# Patient Record
Sex: Female | Born: 1970 | Race: Black or African American | Hispanic: No | Marital: Married | Smoking: Never smoker
Health system: Southern US, Community
[De-identification: ages and names within clinical notes are randomized; demographics above are authoritative.]

## PROBLEM LIST (undated history)

## (undated) DIAGNOSIS — M1990 Unspecified osteoarthritis, unspecified site: Secondary | ICD-10-CM

## (undated) DIAGNOSIS — R519 Headache, unspecified: Secondary | ICD-10-CM

## (undated) DIAGNOSIS — Z8701 Personal history of pneumonia (recurrent): Secondary | ICD-10-CM

## (undated) DIAGNOSIS — J189 Pneumonia, unspecified organism: Secondary | ICD-10-CM

## (undated) DIAGNOSIS — R51 Headache: Secondary | ICD-10-CM

## (undated) HISTORY — PX: CHOLECYSTECTOMY: SHX55

---

## 1999-02-17 ENCOUNTER — Ambulatory Visit (HOSPITAL_COMMUNITY): Admission: RE | Admit: 1999-02-17 | Discharge: 1999-02-17 | Payer: Self-pay | Admitting: Obstetrics and Gynecology

## 1999-02-17 ENCOUNTER — Encounter: Payer: Self-pay | Admitting: Obstetrics and Gynecology

## 1999-05-05 ENCOUNTER — Ambulatory Visit (HOSPITAL_COMMUNITY): Admission: RE | Admit: 1999-05-05 | Discharge: 1999-05-05 | Payer: Self-pay | Admitting: Obstetrics and Gynecology

## 1999-05-05 ENCOUNTER — Encounter: Payer: Self-pay | Admitting: Obstetrics and Gynecology

## 1999-05-27 ENCOUNTER — Encounter (INDEPENDENT_AMBULATORY_CARE_PROVIDER_SITE_OTHER): Payer: Self-pay

## 1999-05-27 ENCOUNTER — Inpatient Hospital Stay (HOSPITAL_COMMUNITY): Admission: AD | Admit: 1999-05-27 | Discharge: 1999-05-31 | Payer: Self-pay | Admitting: Obstetrics and Gynecology

## 1999-06-04 ENCOUNTER — Inpatient Hospital Stay (HOSPITAL_COMMUNITY): Admission: AD | Admit: 1999-06-04 | Discharge: 1999-06-04 | Payer: Self-pay | Admitting: Obstetrics and Gynecology

## 1999-06-05 ENCOUNTER — Encounter: Payer: Self-pay | Admitting: Obstetrics and Gynecology

## 2008-07-22 ENCOUNTER — Emergency Department (HOSPITAL_COMMUNITY): Admission: EM | Admit: 2008-07-22 | Discharge: 2008-07-22 | Payer: Self-pay | Admitting: Family Medicine

## 2008-07-29 ENCOUNTER — Emergency Department (HOSPITAL_COMMUNITY): Admission: EM | Admit: 2008-07-29 | Discharge: 2008-07-29 | Payer: Self-pay | Admitting: Emergency Medicine

## 2010-11-25 NOTE — Op Note (Signed)
[facility]  Patient:    Victoria Kaufman                        MRN: [id number] Proc. Date: 05/27/99 Adm. Date:  [id number] Attending:  Michaele Offer                           Operative Report  PREOPERATIVE DIAGNOSIS:       Intrauterine pregnancy at 40 weeks.  Breech presentation.  Group B Strep carrier.  Desires surgical sterility.  POSTOPERATIVE DIAGNOSIS:      Intrauterine pregnancy at 40 weeks.  Breech presentation.  Group B Strep carrier.  Desires surgical sterility.  OPERATION:                    Classical cesarean section and bilateral partial salpingectomy.  SURGEON:                      Zenaida Niece, M.D.  ASSISTANT:  ANESTHESIA:                   Spinal.  ESTIMATED BLOOD LOSS:         1200 cc.  CHEMOPROPHYLAXIS:             Penicillin for Group B Strep prior to procedure and Ancef 1 gram after cord clamp.  FINDINGS:                     Essentially no lower uterine segment necessitating the classical cesarean section.  The fetus was in a double footling breech presentation and was a viable female with Apgars of 8 and 8 that weighed 6 pounds 11 ounces and did sustain a laceration to the right heel.  COUNTS:                       Correct.  CONDITION:                    Stable.  DESCRIPTION OF PROCEDURE:     After appropriate informced consent was obtained, the patient was taken to the operating room and placed in the sitting position. Burnett Corrente, Montez Hageman., M.D. instilled spinal anesthesia, and she was placed in the dorsal supine position with a left lateral tilt.  Her abdomen was prepped and draped in the usual sterile fashion for a cesarean section and a Foley catheter inserted.  The level of her anesthesia was found to be adequate and her abdomen was entered via a standard Pfannenstiel incision.  The bladder blade was placed and the vesicouterine peritoneum was difficult to identify.  It was finally  identified nd incised and the bladder pushed inferiorly.  However, the lower uterine segment appeared very very narrow and I did not feel that there would be enough room to  deliver the breech.  Thus, I decided to proceed with a classical cesarean section. A vertical incision was made in the midline of the uterus and carried down to the endometrial cavity.  The cavity was entered superiorly and then was attempting o enter somewhat inferiorly to this the fetal heel came to the incision was lacerated with the knife.  The incision was then extended superiorly and a short distance  inferiorly with bandage scissors to facilitate delivery.  The feet were grasped and the infant delivered to the shoulders.  The arms were  swept across the chest and the head was easily delivered.  A nuchal cord x 2 was reduced.  The mouth and nares were suctioned and the cord was doubly clamped and cut and the infant handed to the awaiting pediatric team.  Cord blood and a segment of cord for gas were obtained and the placenta delivered spontaneously.  The uterus was exteriorized and wiped dry with a clean lap pad.  The incision was then closed in three layers, the first two layers being running locking layers with #1 chromic and the third layer being a baseball stitch to reapproximate uterine serosa with 3-0 Vicryl.  This achieved  good hemostasis and good uterine closure.  Attention was then turned to the tubal ligation.  A knuckle of tube on each side was ligated with 0 plain gut suture and the knuckle of tube was removed.  Both ostia were identified and the stumps were hemostatic.  At this point the uterus was returned to the abdomen.  Both pericolic gutters were inspected and all clots and debris removed, and both tubal ligation sites found to be intact.  The uterine incision was again inspected and found to be hemostatic.  Bleeding from serosal edges was controlled with electrocautery  as ell as a few small bleeders down on the peritoneum beneath the bladder.  The subfascial space was then irrigated and made hemostatic with electrocautery.  The fascia was closed in a running fashion starting at both ends and meeting in the middle with 0 Vicryl.  The subcutaneous tissue was then irrigated and made hemostatic with electrocautery and the subcutaneous tissue was closed with a running suture of -0 Vicryl.  The skin was then closed with staples.  A sterile dressing was applied. The patient tolerated the procedure well and was taken to the recovery room in stable condition. DD:  05/27/99 TD:  05/30/99 Job: [id number]

## 2012-12-23 ENCOUNTER — Emergency Department (INDEPENDENT_AMBULATORY_CARE_PROVIDER_SITE_OTHER): Payer: PRIVATE HEALTH INSURANCE

## 2012-12-23 ENCOUNTER — Emergency Department (HOSPITAL_COMMUNITY)
Admission: EM | Admit: 2012-12-23 | Discharge: 2012-12-23 | Disposition: A | Discharge status: Home / Self Care | Payer: PRIVATE HEALTH INSURANCE | Source: Home / Self Care | Attending: Emergency Medicine | Admitting: Emergency Medicine

## 2012-12-23 ENCOUNTER — Encounter (HOSPITAL_COMMUNITY): Payer: Self-pay | Admitting: Emergency Medicine

## 2012-12-23 DIAGNOSIS — J189 Pneumonia, unspecified organism: Secondary | ICD-10-CM

## 2012-12-23 MED ORDER — HYDROCOD POLST-CHLORPHEN POLST 10-8 MG/5ML PO LQCR: 5.0000 mL | Freq: Two times a day (BID) | ORAL | Status: DC | PRN

## 2012-12-23 MED ORDER — CEFTRIAXONE SODIUM 1 G IJ SOLR
1.0000 g | Freq: Once | INTRAMUSCULAR | Status: AC
  Administered 2012-12-23: 1 g via INTRAMUSCULAR

## 2012-12-23 MED ORDER — CEFTRIAXONE SODIUM 1 G IJ SOLR
INTRAMUSCULAR | Status: AC
  Filled 2012-12-23: qty 10

## 2012-12-23 MED ORDER — LIDOCAINE HCL (PF) 1 % IJ SOLN
INTRAMUSCULAR | Status: AC
  Filled 2012-12-23: qty 5

## 2012-12-23 MED ORDER — IBUPROFEN 800 MG PO TABS
800.0000 mg | ORAL_TABLET | Freq: Once | ORAL | Status: AC
Start: 2012-12-23 — End: 2012-12-23
  Administered 2012-12-23: 800 mg via ORAL

## 2012-12-23 MED ORDER — LEVOFLOXACIN 500 MG PO TABS: 500.0000 mg | ORAL_TABLET | Freq: Every day | ORAL | Status: DC

## 2012-12-23 MED ORDER — TRAMADOL HCL 50 MG PO TABS: 100.0000 mg | ORAL_TABLET | Freq: Three times a day (TID) | ORAL | Status: DC | PRN

## 2012-12-23 MED ORDER — ALBUTEROL SULFATE HFA 108 (90 BASE) MCG/ACT IN AERS: 2.0000 | INHALATION_SPRAY | Freq: Four times a day (QID) | RESPIRATORY_TRACT | Status: DC

## 2012-12-23 MED ORDER — IBUPROFEN 800 MG PO TABS
ORAL_TABLET | ORAL | Status: AC
  Filled 2012-12-23: qty 1

## 2012-12-23 NOTE — ED Notes (Signed)
Ginger ale, peanut butter and grahma crackers

## 2012-12-23 NOTE — ED Notes (Signed)
Patient aware of delay in discharge post injection administration

## 2012-12-23 NOTE — ED Notes (Signed)
Patient reports severe cough, onset last Tuesday.  Patient reports a cough several weeks ago, but not nearly as bad as this cough and seemed to go away.  Reports runny nose today, reports fever, chest congestion.  Has pain in chest and back with cough.

## 2012-12-23 NOTE — ED Provider Notes (Signed)
Chief Complaint:   Chief Complaint  Patient presents with  . Cough    History of Present Illness:   Victoria Kaufman is a 42 year old female who has had a one-week history of cough productive of yellow, bloody sputum, chest pain when she coughs radiating to her back, shortness of breath, chest tightness, wheezing, fever of up to 102, chills, nasal congestion with clear drainage, and headache she has been exposed to a coworker with similar symptoms. She tried DayQuil and Alka-Seltzer cold plus for her symptoms without relief.  Review of Systems:  Other than noted above, the patient denies any of the following symptoms: Systemic:  No fevers, chills, sweats, weight loss or gain, fatigue, or tiredness. Eye:  No redness or discharge. ENT:  No ear pain, drainage, headache, nasal congestion, drainage, sinus pressure, difficulty swallowing, or sore throat. Neck:  No neck pain or swollen glands. Lungs:  No cough, sputum production, hemoptysis, wheezing, chest tightness, shortness of breath or chest pain. GI:  No abdominal pain, nausea, vomiting or diarrhea.  PMFSH:  Past medical history, family history, social history, meds, and allergies were reviewed.   Physical Exam:   Vital signs:  BP 142/85  Pulse 101  Temp(Src) 99.8 F (37.7 C) (Oral)  Resp 18  SpO2 99%  LMP 12/21/2012 General:  Alert and oriented.  In no distress.  Skin warm and dry. Eye:  No conjunctival injection or drainage. Lids were normal. ENT:  TMs and canals were normal, without erythema or inflammation.  Nasal mucosa was clear and uncongested, without drainage.  Mucous membranes were moist.  Pharynx was clear with no exudate or drainage.  There were no oral ulcerations or lesions. Neck:  Supple, no adenopathy, tenderness or mass. Lungs:  No respiratory distress.  Lungs were clear to auscultation, without wheezes, rales or rhonchi.  Breath sounds were clear and equal bilaterally.  Heart:  Regular rhythm, without gallops, murmers  or rubs. Skin:  Clear, warm, and dry, without rash or lesions.  Radiology:  Dg Chest 2 View  12/23/2012   *RADIOLOGY REPORT*  Clinical Data: 1 week history of productive cough, fever and, this is  CHEST - 2 VIEW  Comparison: 07/29/2008  Findings: Grossly unchanged cardiac silhouette and mediastinal contours given slightly reduced lung volumes.  Left infrahilar heterogeneous air space opacities worrisome for infection.  No definite pleural effusion or pneumothorax.  A surgical clip overlies the midline of the right upper abdomen.  Grossly unchanged bones.  IMPRESSION: Findings worrisome for left lower lung pneumonia.  Note, this is a similar location to prior area of infection on the 2010 examination.  As such, a follow-up chest radiograph in 4 to 6 weeks after treatment is recommended to ensure resolution.   Original Report Authenticated By: Tacey Ruiz, MD    Course in Urgent Care Center:   Given Rocephin 1 g IM. For pain she was given ibuprofen 800 mg by mouth.  Assessment:  The encounter diagnosis was Community acquired pneumonia.  She has a left lower lobe pneumonia. I think this can be treated as an outpatient. She was given IM Rocephin and will followup with by mouth Levaquin. Return here in 48 hours. I told her she needed to have her followup chest x-ray in about a month to make sure that this is clearing up completely. Apparently she has had a chest x-ray in the same area before without an interval x-ray to document clearing.  Plan:   1.  The following meds were prescribed:  Discharge Medication List as of 12/23/2012  2:48 PM    START taking these medications   Details  albuterol (PROVENTIL HFA;VENTOLIN HFA) 108 (90 BASE) MCG/ACT inhaler Inhale 2 puffs into the lungs 4 (four) times daily., Starting 12/23/2012, Until Discontinued, Normal    chlorpheniramine-HYDROcodone (TUSSIONEX) 10-8 MG/5ML LQCR Take 5 mLs by mouth every 12 (twelve) hours as needed., Starting 12/23/2012, Until  Discontinued, Normal    levofloxacin (LEVAQUIN) 500 MG tablet Take 1 tablet (500 mg total) by mouth daily., Starting 12/23/2012, Until Discontinued, Normal    traMADol (ULTRAM) 50 MG tablet Take 2 tablets (100 mg total) by mouth every 8 (eight) hours as needed for pain., Starting 12/23/2012, Until Discontinued, Normal       2.  The patient was instructed in symptomatic care and handouts were given. 3.  The patient was told to return if becoming worse in any way, for a scheduled followup here in 48 hours, and given some red flag symptoms such as chest pain or difficulty breathing that would indicate earlier return. 4.  Follow up here in 48 hours, and with a primary care physician in a month for a repeat chest x-ray to document clearing.      Reuben Likes, MD 12/23/12 [phone]

## 2012-12-31 ENCOUNTER — Emergency Department (INDEPENDENT_AMBULATORY_CARE_PROVIDER_SITE_OTHER)
Admission: EM | Admit: 2012-12-31 | Discharge: 2012-12-31 | Disposition: A | Discharge status: Home / Self Care | Payer: PRIVATE HEALTH INSURANCE | Source: Home / Self Care | Attending: Family Medicine | Admitting: Family Medicine

## 2012-12-31 ENCOUNTER — Encounter (HOSPITAL_COMMUNITY): Payer: Self-pay | Admitting: *Deleted

## 2012-12-31 ENCOUNTER — Emergency Department (INDEPENDENT_AMBULATORY_CARE_PROVIDER_SITE_OTHER): Payer: PRIVATE HEALTH INSURANCE

## 2012-12-31 DIAGNOSIS — J189 Pneumonia, unspecified organism: Secondary | ICD-10-CM

## 2012-12-31 MED ORDER — AZITHROMYCIN 250 MG PO TABS: ORAL_TABLET | ORAL | Status: DC

## 2012-12-31 NOTE — ED Notes (Signed)
Seen  8  Days   Ago  By  Marlou Starks  For  pnuemonia       States  Pain is  Better  But  Continues  To  Cough  /  Wheeze        Pt states  Has  1  Day  Of  Anti  Biotics  Left      She  Is  Sitting  Upright  On  Exam    Table  Speaking in  Complete  sentances

## 2012-12-31 NOTE — ED Provider Notes (Signed)
History    CSN: [id number] Arrival date & time 12/31/12  1021  None    Chief Complaint  Patient presents with  . Follow-up   (Consider location/radiation/quality/duration/timing/severity/associated sxs/prior Treatment) Patient is a 42 y.o. female presenting with cough. The history is provided by the patient.  Cough Cough characteristics:  Productive Sputum characteristics:  Yellow Severity:  Moderate Chronicity:  New Smoker: no   Context comment:  Seen 6/16 with cap and treated, taking meds as prescribed, fever resolved but still with persistent cough and sob episodes. , 60 % improved. Associated symptoms: shortness of breath    History reviewed. No pertinent past medical history. Past Surgical History  Procedure Laterality Date  . Cholecystectomy    . Cesarean section     History reviewed. No pertinent family history. History  Substance Use Topics  . Smoking status: Never Smoker   . Smokeless tobacco: Not on file  . Alcohol Use: No   OB History   Grav Para Term Preterm Abortions TAB SAB Ect Mult Living                 Review of Systems  Constitutional: Negative.   HENT: Negative.   Respiratory: Positive for cough and shortness of breath.     Allergies  Review of patient's allergies indicates no known allergies.  Home Medications   Current Outpatient Rx  Name  Route  Sig  Dispense  Refill  . albuterol (PROVENTIL HFA;VENTOLIN HFA) 108 (90 BASE) MCG/ACT inhaler   Inhalation   Inhale 2 puffs into the lungs 4 (four) times daily.   1 Inhaler   0   . azithromycin (ZITHROMAX Z-PAK) 250 MG tablet      Take as directed on pack until finished   6 each   0   . chlorpheniramine-HYDROcodone (TUSSIONEX) 10-8 MG/5ML LQCR   Oral   Take 5 mLs by mouth every 12 (twelve) hours as needed.   140 mL   0   . levofloxacin (LEVAQUIN) 500 MG tablet   Oral   Take 1 tablet (500 mg total) by mouth daily.   10 tablet   0   . Pseudoephedrine-APAP-DM (DAYQUIL PO)  Oral   Take by mouth.         . sodium-potassium bicarbonate (ALKA-SELTZER GOLD) TBEF   Oral   Take 1 tablet by mouth daily as needed.         . traMADol (ULTRAM) 50 MG tablet   Oral   Take 2 tablets (100 mg total) by mouth every 8 (eight) hours as needed for pain.   30 tablet   0    BP 154/87  Pulse 97  Temp(Src) 98 F (36.7 C) (Oral)  Resp 16  SpO2 98%  LMP 12/21/2012 Physical Exam  Nursing note and vitals reviewed. Constitutional: She is oriented to person, place, and time. She appears well-developed and well-nourished.  HENT:  Head: Normocephalic and atraumatic.  Mouth/Throat: Oropharynx is clear and moist.  Eyes: Pupils are equal, round, and reactive to light.  Neck: Normal range of motion. Neck supple.  Cardiovascular: Normal rate, regular rhythm, normal heart sounds and intact distal pulses.   Pulmonary/Chest: Effort normal and breath sounds normal. She has no rales.  Abdominal: Soft. Bowel sounds are normal.  Lymphadenopathy:    She has no cervical adenopathy.  Neurological: She is alert and oriented to person, place, and time.  Skin: Skin is warm and dry.    ED Course  Procedures (including critical care time)  Labs Reviewed - No data to display Dg Chest 2 View  12/31/2012   *RADIOLOGY REPORT*  Clinical Data: Follow up left lower lobe pneumonia.  Persistent cough.  CHEST - 2 VIEW  Comparison: Two-view chest x-ray 12/23/2012, 07/29/2008.  Findings: Cardiomediastinal silhouette unremarkable, unchanged. Interval near complete resolution of the patchy airspace opacities in the left lower lobe.  No new pulmonary parenchymal abnormalities.  No pleural effusions.  Visualized bony thorax intact.  IMPRESSION: Near complete resolution of the patchy pneumonia in the left lower lobe.  No new abnormalities.   Original Report Authenticated By: Hulan Saas, M.D.   1. CAP (community acquired pneumonia)     MDM  X-rays reviewed and report per radiologist.   Linna Hoff, MD 12/31/12 1152

## 2013-05-12 ENCOUNTER — Encounter (HOSPITAL_COMMUNITY): Payer: Self-pay | Admitting: Emergency Medicine

## 2013-05-12 ENCOUNTER — Emergency Department (INDEPENDENT_AMBULATORY_CARE_PROVIDER_SITE_OTHER): Payer: PRIVATE HEALTH INSURANCE

## 2013-05-12 ENCOUNTER — Emergency Department (HOSPITAL_COMMUNITY)
Admission: EM | Admit: 2013-05-12 | Discharge: 2013-05-12 | Disposition: A | Discharge status: Home / Self Care | Payer: PRIVATE HEALTH INSURANCE | Source: Home / Self Care | Attending: Emergency Medicine | Admitting: Emergency Medicine

## 2013-05-12 DIAGNOSIS — J189 Pneumonia, unspecified organism: Secondary | ICD-10-CM

## 2013-05-12 MED ORDER — LEVOFLOXACIN 750 MG PO TABS: 750.0000 mg | ORAL_TABLET | Freq: Every day | ORAL | Status: DC

## 2013-05-12 NOTE — ED Provider Notes (Signed)
CSN: [id number]     Arrival date & time 05/12/13  1611 History   First MD Initiated Contact with Patient 05/12/13 1750     Chief Complaint  Patient presents with  . Cough   (Consider location/radiation/quality/duration/timing/severity/associated sxs/prior Treatment) Patient is a 42 y.o. female presenting with cough. The history is provided by the patient.  Cough Cough characteristics:  Non-productive Severity:  Moderate Onset quality:  Gradual Duration:  3 weeks Timing:  Intermittent Progression:  Unchanged Chronicity:  New Smoker: no   Context: smoke exposure and upper respiratory infection   Relieved by:  Nothing Worsened by:  Lying down Ineffective treatments:  Cough suppressants Associated symptoms: sinus congestion   Associated symptoms: no chest pain, no chills, no fever, no rhinorrhea, no shortness of breath and no sore throat     History reviewed. No pertinent past medical history. Past Surgical History  Procedure Laterality Date  . Cholecystectomy    . Cesarean section     History reviewed. No pertinent family history. History  Substance Use Topics  . Smoking status: Never Smoker   . Smokeless tobacco: Not on file  . Alcohol Use: No   OB History   Grav Para Term Preterm Abortions TAB SAB Ect Mult Living                 Review of Systems  Constitutional: Negative for fever and chills.  HENT: Positive for congestion and postnasal drip. Negative for rhinorrhea and sore throat.   Respiratory: Positive for cough. Negative for shortness of breath.   Cardiovascular: Negative for chest pain.    Allergies  Review of patient's allergies indicates no known allergies.  Home Medications   Current Outpatient Rx  Name  Route  Sig  Dispense  Refill  . albuterol (PROVENTIL HFA;VENTOLIN HFA) 108 (90 BASE) MCG/ACT inhaler   Inhalation   Inhale 2 puffs into the lungs 4 (four) times daily.   1 Inhaler   0   . azithromycin (ZITHROMAX Z-PAK) 250 MG tablet      Take  as directed on pack until finished   6 each   0   . chlorpheniramine-HYDROcodone (TUSSIONEX) 10-8 MG/5ML LQCR   Oral   Take 5 mLs by mouth every 12 (twelve) hours as needed.   140 mL   0   . levofloxacin (LEVAQUIN) 500 MG tablet   Oral   Take 1 tablet (500 mg total) by mouth daily.   10 tablet   0   . levofloxacin (LEVAQUIN) 750 MG tablet   Oral   Take 1 tablet (750 mg total) by mouth daily.   7 tablet   0   . Pseudoephedrine-APAP-DM (DAYQUIL PO)   Oral   Take by mouth.         . sodium-potassium bicarbonate (ALKA-SELTZER GOLD) TBEF   Oral   Take 1 tablet by mouth daily as needed.         . traMADol (ULTRAM) 50 MG tablet   Oral   Take 2 tablets (100 mg total) by mouth every 8 (eight) hours as needed for pain.   30 tablet   0    BP 155/97  Pulse 78  Temp(Src) 98.5 F (36.9 C) (Oral)  Resp 18  SpO2 100%  LMP 04/02/2013 Physical Exam  Constitutional: She appears well-developed and well-nourished. No distress.  HENT:  Right Ear: Tympanic membrane, external ear and ear canal normal.  Left Ear: Tympanic membrane, external ear and ear canal normal.  Nose: Mucosal edema  present. No rhinorrhea. Right sinus exhibits no maxillary sinus tenderness and no frontal sinus tenderness. Left sinus exhibits no maxillary sinus tenderness and no frontal sinus tenderness.  Mouth/Throat: Oropharynx is clear and moist.  Cardiovascular: Normal rate and regular rhythm.   Pulmonary/Chest: Effort normal and breath sounds normal.    ED Course  Procedures (including critical care time) Labs Review Labs Reviewed - No data to display Imaging Review Dg Chest 2 View  05/12/2013   CLINICAL DATA:  Cough for 3-1/2 weeks. Congestion. Headaches.  EXAM: CHEST  2 VIEW  COMPARISON:  12/31/2012  FINDINGS: Heart size is normal. There is perihilar peribronchial thickening. Patchy density is identified within the right middle lobe, consistent with infectious process. No pulmonary edema. Visualized  osseous structures have a normal appearance. Surgical clips are identified in the right upper quadrant of the abdomen.  IMPRESSION: 1. Bronchitic changes. 2. Right middle lobe infiltrate.   Electronically Signed   By: Rosalie Gums M.D.   On: 05/12/2013 17:25    EKG Interpretation     Ventricular Rate:    PR Interval:    QRS Duration:   QT Interval:    QTC Calculation:   R Axis:     Text Interpretation:              MDM   1. Community acquired pneumonia   rx levquin 750mg  daily for 7 days. Pt to f/u with pcp (has appt with new provider in 10 days).     Cathlyn Parsons, NP 05/12/13 1754

## 2013-05-12 NOTE — ED Provider Notes (Signed)
Medical screening examination/treatment/procedure(s) were performed by non-physician practitioner and as supervising physician I was immediately available for consultation/collaboration.  Brittiny Levitz, M.D.  Torin Whisner C Liliann File, MD 05/12/13 1802 

## 2013-05-12 NOTE — ED Notes (Signed)
Reported history of pneumonia 12-2012. C/o she has been coughing for past 3-4 weeks. No medications; no relief w neti pot, OTC medications; secretions reportedly vary from yellow to white

## 2014-12-09 ENCOUNTER — Emergency Department (INDEPENDENT_AMBULATORY_CARE_PROVIDER_SITE_OTHER): Payer: BLUE CROSS/BLUE SHIELD

## 2014-12-09 ENCOUNTER — Encounter (HOSPITAL_COMMUNITY): Payer: Self-pay | Admitting: Emergency Medicine

## 2014-12-09 ENCOUNTER — Emergency Department (HOSPITAL_COMMUNITY)
Admission: EM | Admit: 2014-12-09 | Discharge: 2014-12-09 | Disposition: A | Discharge status: Home / Self Care | Payer: BLUE CROSS/BLUE SHIELD | Source: Home / Self Care | Attending: Family Medicine | Admitting: Family Medicine

## 2014-12-09 DIAGNOSIS — M25572 Pain in left ankle and joints of left foot: Secondary | ICD-10-CM

## 2014-12-09 DIAGNOSIS — M6588 Other synovitis and tenosynovitis, other site: Secondary | ICD-10-CM

## 2014-12-09 DIAGNOSIS — M7752 Other enthesopathy of left foot: Secondary | ICD-10-CM

## 2014-12-09 MED ORDER — DICLOFENAC SODIUM 1 % TD GEL: 2.0000 g | Freq: Four times a day (QID) | TRANSDERMAL | Status: DC

## 2014-12-09 MED ORDER — DICLOFENAC SODIUM 75 MG PO TBEC: 75.0000 mg | DELAYED_RELEASE_TABLET | Freq: Two times a day (BID) | ORAL | Status: DC

## 2014-12-09 NOTE — ED Provider Notes (Signed)
CSN: [id number]     Arrival date & time 12/09/14  1529 History   First MD Initiated Contact with Patient 12/09/14 1650     Chief Complaint  Patient presents with  . Ankle Pain   (Consider location/radiation/quality/duration/timing/severity/associated sxs/prior Treatment) HPI Comments: Patient presents with left ankle pain x 2-3 weeks. No specific injury is noted. Pain and mild swelling along the medial aspect. No erythema or warmth. No pain at rest, only with weight bear. She has been working more hours recently with standing and walking (12-14 hour days) and thinks this could be contributing. No additional joint pain, swelling or stiffness. No history of gout or inflammatory arthritis is noted.   Patient is a 44 y.o. female presenting with ankle pain. The history is provided by the patient.  Ankle Pain   History reviewed. No pertinent past medical history. Past Surgical History  Procedure Laterality Date  . Cholecystectomy    . Cesarean section     History reviewed. No pertinent family history. History  Substance Use Topics  . Smoking status: Never Smoker   . Smokeless tobacco: Not on file  . Alcohol Use: No   OB History    No data available     Review of Systems  All other systems reviewed and are negative.   Allergies  Review of patient's allergies indicates no known allergies.  Home Medications   Prior to Admission medications   Medication Sig Start Date End Date Taking? Authorizing Provider  albuterol (PROVENTIL HFA;VENTOLIN HFA) 108 (90 BASE) MCG/ACT inhaler Inhale 2 puffs into the lungs 4 (four) times daily. 12/23/12   Reuben Likesavid C Keller, MD  azithromycin (ZITHROMAX Z-PAK) 250 MG tablet Take as directed on pack until finished 12/31/12   Linna HoffJames D Kindl, MD  chlorpheniramine-HYDROcodone (TUSSIONEX) 10-8 MG/5ML [facility] Take 5 mLs by mouth every 12 (twelve) hours as needed. 12/23/12   Reuben Likesavid C Keller, MD  diclofenac (VOLTAREN) 75 MG EC tablet Take 1 tablet (75 mg total) by mouth 2  (two) times daily. 12/09/14   Riki SheerMichelle G Young, PA-C  diclofenac sodium (VOLTAREN) 1 % GEL Apply 2 g topically 4 (four) times daily. 12/09/14   Riki SheerMichelle G Young, PA-C  levofloxacin (LEVAQUIN) 500 MG tablet Take 1 tablet (500 mg total) by mouth daily. 12/23/12   Reuben Likesavid C Keller, MD  levofloxacin (LEVAQUIN) 750 MG tablet Take 1 tablet (750 mg total) by mouth daily. 05/12/13   Cathlyn ParsonsAngela M Kabbe, NP  Pseudoephedrine-APAP-DM (DAYQUIL PO) Take by mouth.    Historical Provider, MD  sodium-potassium bicarbonate (ALKA-SELTZER GOLD) TBEF Take 1 tablet by mouth daily as needed.    Historical Provider, MD  traMADol (ULTRAM) 50 MG tablet Take 2 tablets (100 mg total) by mouth every 8 (eight) hours as needed for pain. 12/23/12   Reuben Likesavid C Keller, MD   BP 149/88 mmHg  Pulse 83  Temp(Src) 97.8 F (36.6 C) (Oral)  Resp 16  SpO2 100%  LMP 12/07/2014 Physical Exam  Constitutional: She is oriented to person, place, and time. She appears well-developed and well-nourished. No distress.  Pulmonary/Chest: Effort normal.  Musculoskeletal:  Mild medial swelling is noted along the malleolus, mild tenderness to palpation along the medial tendon. Full ROM including inversion. No erythema, warmth or frank synovitis is noted.   Neurological: She is alert and oriented to person, place, and time.  Skin: Skin is warm and dry. She is not diaphoretic.  Psychiatric: Her behavior is normal.  Nursing note and vitals reviewed.   ED Course  Procedures (including critical care time) Labs Review Labs Reviewed - No data to display  Imaging Review Dg Ankle Complete Left  12/09/2014   CLINICAL DATA:  LEFT ankle pain for 3 weeks. No injury. Spontaneous onset of ankle pain.  EXAM: LEFT ANKLE COMPLETE - 3+ VIEW  COMPARISON:  None.  FINDINGS: Ankle mortise is congruent and the talar dome is intact. There appears to be soft tissue swelling in the distal leg however this may be secondary tube body habitus. Tiny ossicle is present adjacent to the  medial malleolus compatible with an old avulsion. On the lateral view, there appears to be pes planus however this may be projectional. If the foot needs further evaluation, dedicated views of the affected foot with weight-bearing radiographs is recommended.  IMPRESSION: No acute osseous abnormality.   Electronically Signed   By: Andreas Newport M.D.   On: 12/09/2014 17:48     MDM   1. Tendonitis of ankle, left   2. Ankle pain, left    X-rays normal. Most likely tendonitis in the setting of prolonged standing and walking recently. Treat conservatively with ASO, Voltaren Gel and oral NSAID's. Rest as much as possible, ice and supportive care. If worsens please f/u with Ortho.    Riki Sheer, PA-C 12/09/14 1812

## 2014-12-09 NOTE — ED Notes (Signed)
C/o left ankle pain Denies any injury but states she might have stepped off curb wrong States pain comes and goes States ankle hurts more in the mornings

## 2014-12-09 NOTE — Discharge Instructions (Signed)
Tendinitis Tendinitis is swelling and inflammation of the tendons. Tendons are band-like tissues that connect muscle to bone. Tendinitis commonly occurs in the:   Shoulders (rotator cuff).  Heels (Achilles tendon).  Elbows (triceps tendon). CAUSES Tendinitis is usually caused by overusing the tendon, muscles, and joints involved. When the tissue surrounding a tendon (synovium) becomes inflamed, it is called tenosynovitis. Tendinitis commonly develops in people whose jobs require repetitive motions. SYMPTOMS  Pain.  Tenderness.  Mild swelling. DIAGNOSIS Tendinitis is usually diagnosed by physical exam. Your health care provider may also order X-rays or other imaging tests. TREATMENT Your health care provider may recommend certain medicines or exercises for your treatment. HOME CARE INSTRUCTIONS   Use a sling or splint for as long as directed by your health care provider until the pain decreases.  Put ice on the injured area.  Put ice in a plastic bag.  Place a towel between your skin and the bag.  Leave the ice on for 15-20 minutes, 3-4 times a day, or as directed by your health care provider.  Avoid using the limb while the tendon is painful. Perform gentle range of motion exercises only as directed by your health care provider. Stop exercises if pain or discomfort increase, unless directed otherwise by your health care provider.  Only take over-the-counter or prescription medicines for pain, discomfort, or fever as directed by your health care provider. SEEK MEDICAL CARE IF:   Your pain and swelling increase.  You develop new, unexplained symptoms, especially increased numbness in the hands. MAKE SURE YOU:   Understand these instructions.  Will watch your condition.  Will get help right away if you are not doing well or get worse. Document Released: 06/23/2000 Document Revised: 11/10/2013 Document Reviewed: 09/12/2010 [facility] Patient Information 2015 [employer]. This information is not intended to replace advice given to you by your health care provider. Make sure you discuss any questions you have with your health care provider.    You most likely have tendonitis of the left ankle. Treat with stabilization when up and about. Ice to area. Diclofenac is an anti-inflammatory. Take this 2x a day for the next 1-2 weeks. May use Tylenol if needed. F/U with Ortho if does not improve within a few weeks. Dont work so hard!

## 2015-12-20 IMAGING — DX DG ANKLE COMPLETE 3+V*L*
3 series · 3 of 3 positions shown · non-contrast
Comparison: None.

CLINICAL DATA: LEFT ankle pain for 3 weeks. No injury. Spontaneous
onset of ankle pain.

EXAM:
LEFT ANKLE COMPLETE - 3+ VIEW

[ankle ap]
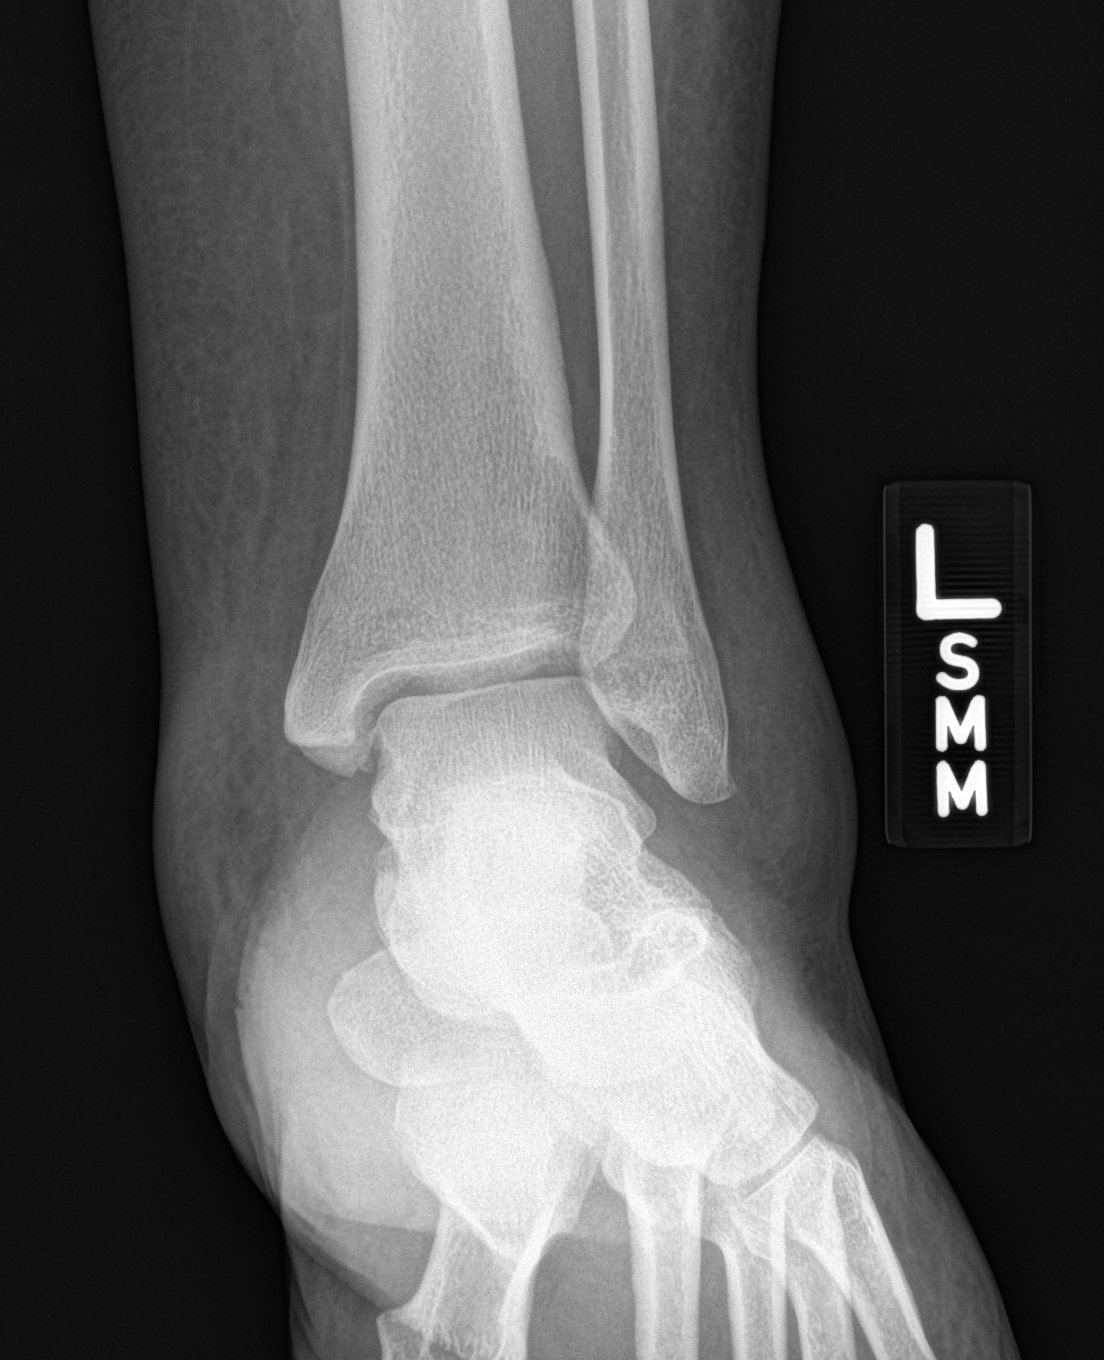

[ankle obl]
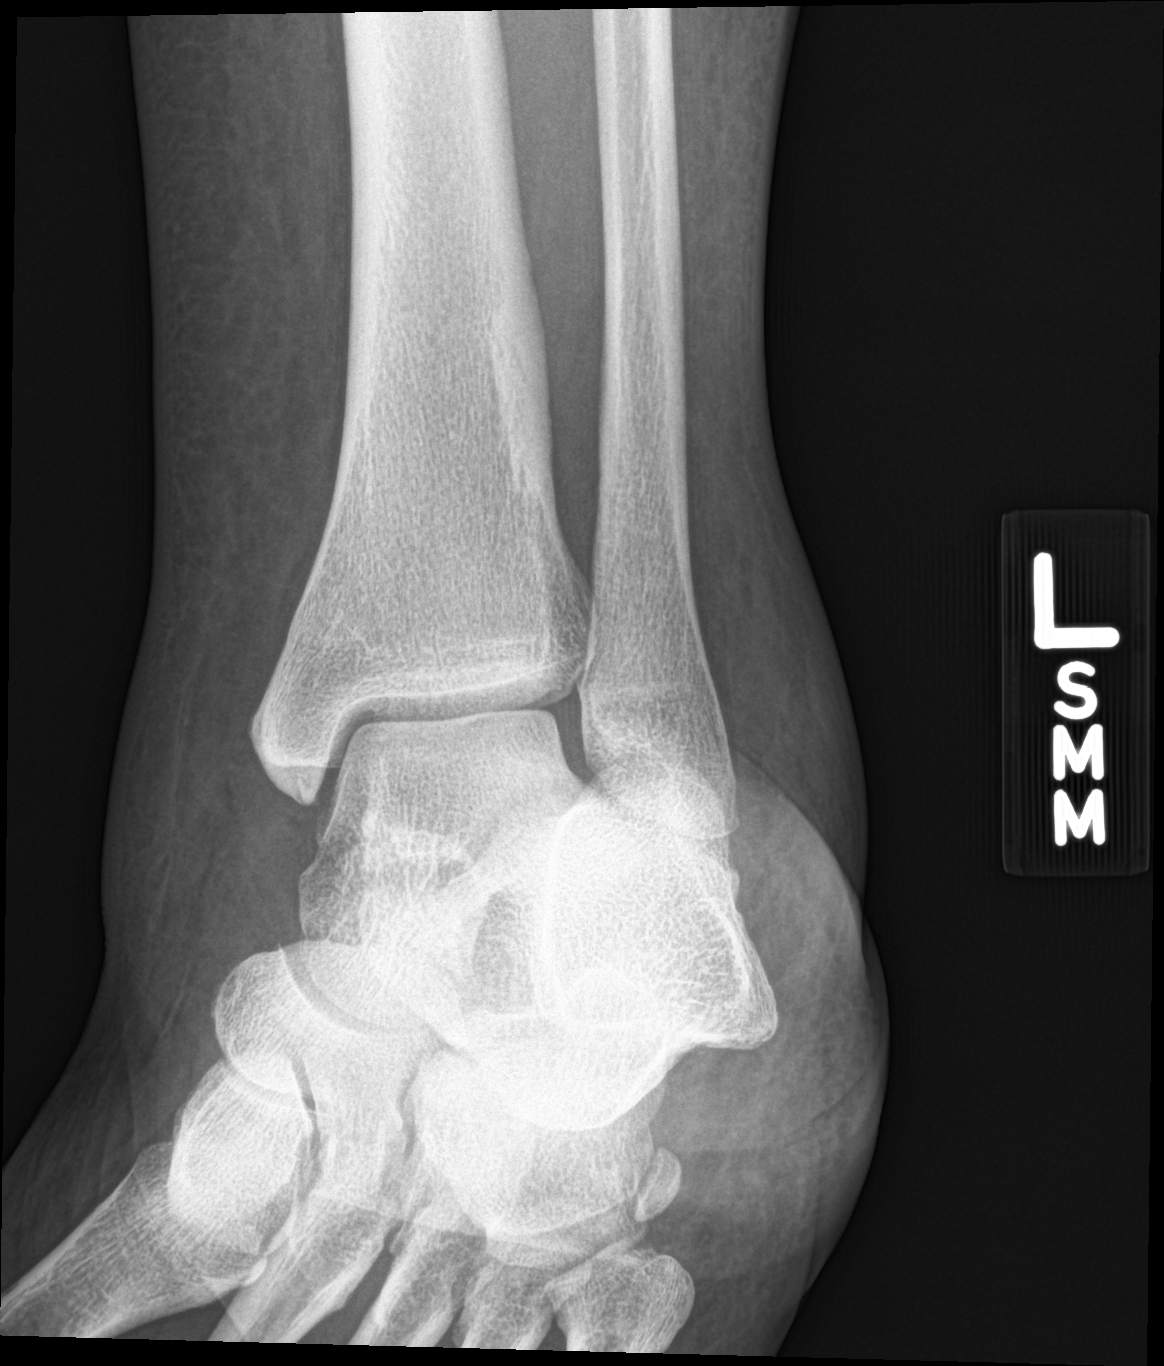

[ankle lat]
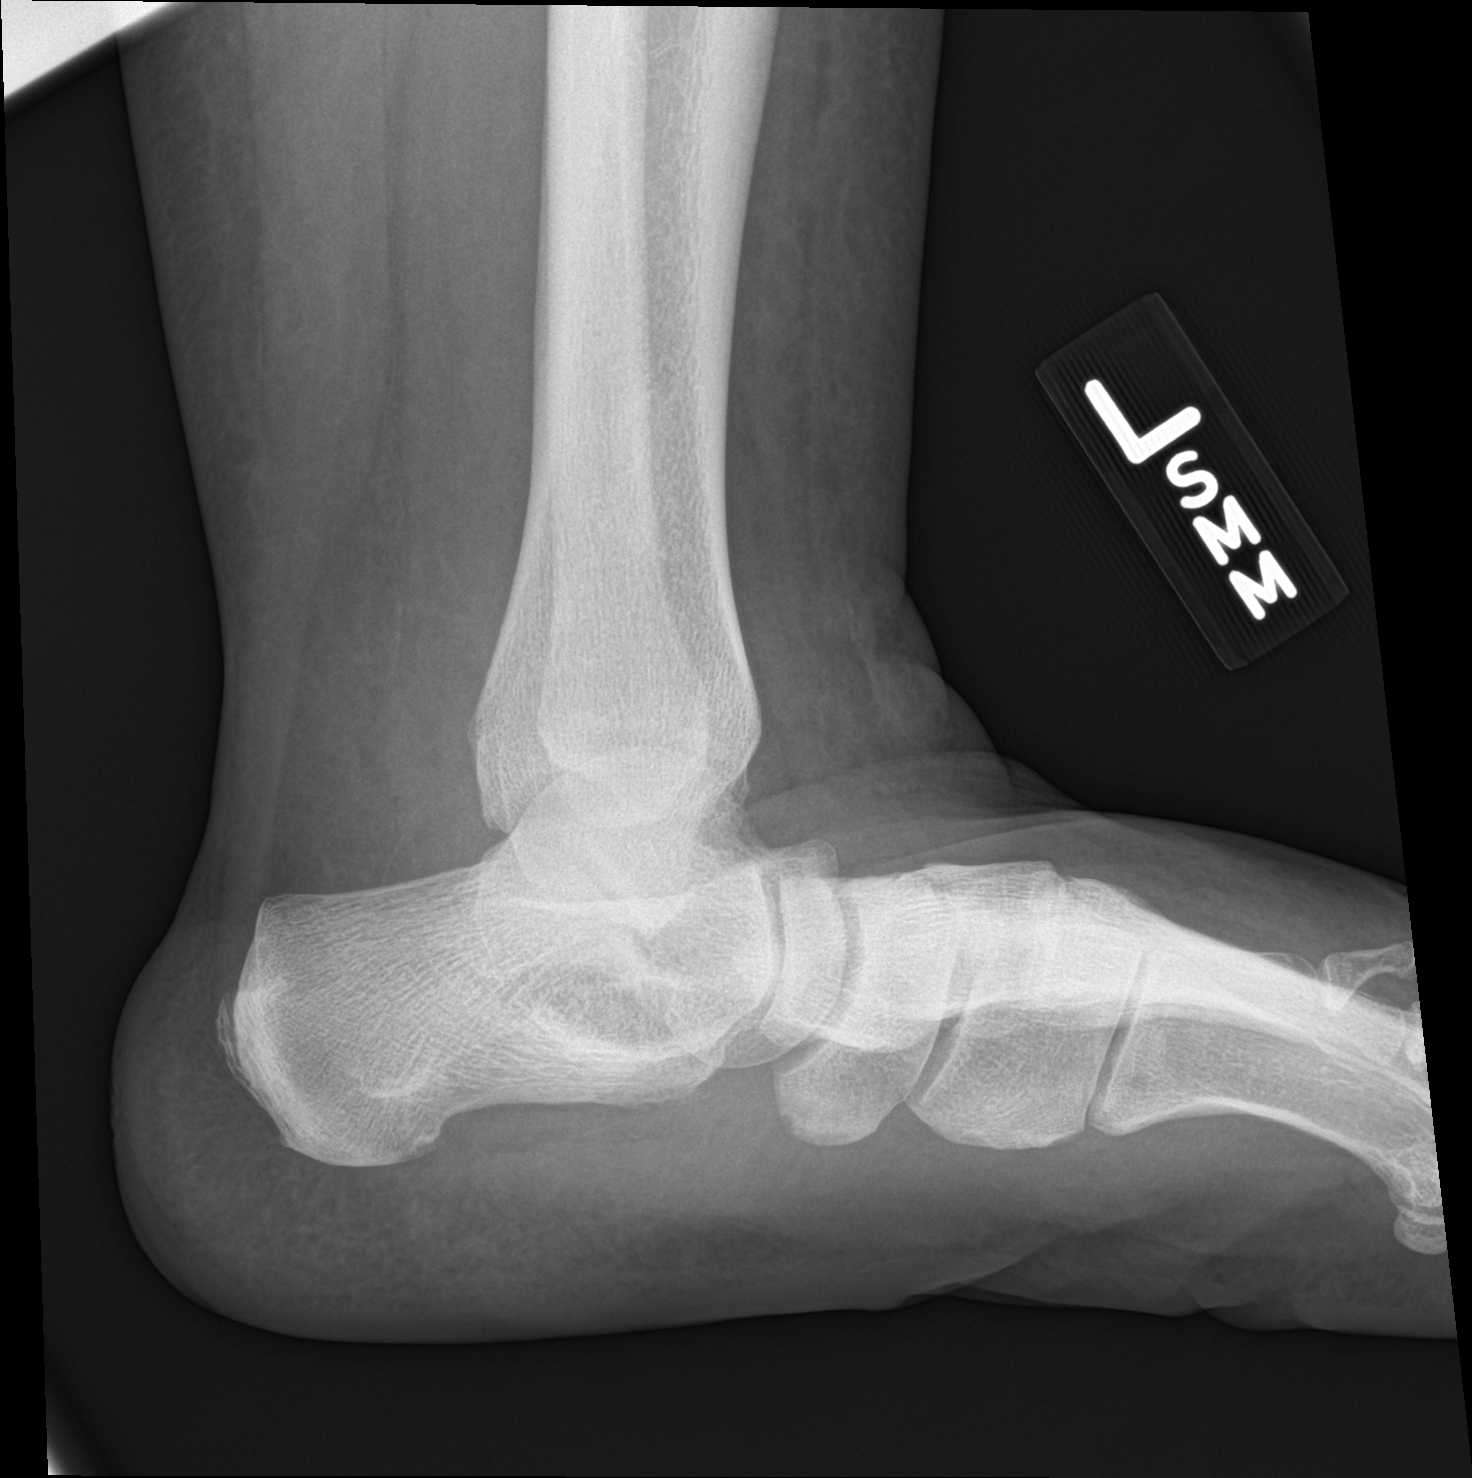

[3 of 3 positions shown; findings below may reference images not displayed]

FINDINGS: Ankle mortise is congruent and the talar dome is intact. There
appears to be soft tissue swelling in the distal leg however this
may be secondary tube body habitus. Tiny ossicle is present adjacent
to the medial malleolus compatible with an old avulsion. On the
lateral view, there appears to be pes planus however this may be
projectional. If the foot needs further evaluation, dedicated views
of the affected foot with weight-bearing radiographs is recommended.
IMPRESSION: No acute osseous abnormality.

## 2017-01-15 ENCOUNTER — Ambulatory Visit (INDEPENDENT_AMBULATORY_CARE_PROVIDER_SITE_OTHER): Payer: Self-pay

## 2017-01-15 ENCOUNTER — Ambulatory Visit (INDEPENDENT_AMBULATORY_CARE_PROVIDER_SITE_OTHER): Payer: BLUE CROSS/BLUE SHIELD | Admitting: Orthopaedic Surgery

## 2017-01-15 DIAGNOSIS — M25552 Pain in left hip: Secondary | ICD-10-CM

## 2017-01-15 DIAGNOSIS — M1612 Unilateral primary osteoarthritis, left hip: Secondary | ICD-10-CM | POA: Insufficient documentation

## 2017-01-15 NOTE — Progress Notes (Signed)
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 She states that her hip is "bone on bone. She did see another orthopedic surgeon in town who turned her down for surgery in terms of her obesity and stated she needed to lose close 200 pounds before considering surgery. She is not a diabetic and on a smoker. She had was with a cane. Her pain is 10 out of 10 and is been worsening for over a year now. It is detrimentally affected her activities daily living, her mobility, and her quality of life. She does not take any significant pain medications. She is not allergic to medications. 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 She does use a cane to ambulate as well. She does not smoke. Her only active medical problem is occasional asthma. She's only one medication for this. She denies any headache, chest pain, shortness of breath, fever, chills, nausea, vomiting.  She is alert and oriented 3 in no acute distress. I had her get up on the exam table second assess her obesity and her left hip. Her right hip exam is normal. Left hip has severe pain with attempts of internal and external rotation. Her leg lengths are near equal. I assessed her soft tissue plane to get an idea of what hip with placement surgery before.  An AP pelvis lateral of her left hip show severe end-stage arthritis. There is cystic changes in the femoral head and acetabulum. There is complete loss of superior lateral joint space. This particular osteophytes and sclerotic changes. Her right hip appears normal.  We went over her x-rays and hip model and explained in detail what hip replacement surgery involves. She understands the heightened risk of acute blood loss anemia, nerve and vessel injury, DVT, infection and malalignment of the implants as well as failure of the implants given her obesity. She does wish proceed  with the surgery in the near future. I do think this is reasonable based on my clinical exam and her x-rays. She has tried and failed all forms conservative treatment and is been treated for well over a year now. All questions were encouraged and answered. We will work on setting the surgery of and I would see her back in 2 weeks postoperative for continued follow-up and wound assessment.

## 2017-02-05 ENCOUNTER — Other Ambulatory Visit (INDEPENDENT_AMBULATORY_CARE_PROVIDER_SITE_OTHER): Payer: Self-pay | Admitting: Orthopaedic Surgery

## 2017-02-05 NOTE — Progress Notes (Signed)
Please place orders in EPIC as patient is being scheduled for a pre-op appointment! Thank you! 

## 2017-02-06 ENCOUNTER — Other Ambulatory Visit (INDEPENDENT_AMBULATORY_CARE_PROVIDER_SITE_OTHER): Payer: Self-pay | Admitting: Physician Assistant

## 2017-02-06 ENCOUNTER — Telehealth (INDEPENDENT_AMBULATORY_CARE_PROVIDER_SITE_OTHER): Payer: Self-pay | Admitting: Orthopaedic Surgery

## 2017-02-06 NOTE — Telephone Encounter (Signed)
Pt called asking if her dentist needs to provide clearance for her have surgery 02/16/17 with Magnus IvanBlackman.

## 2017-02-07 NOTE — Telephone Encounter (Signed)
Patient aware she doesn't need this

## 2017-02-08 NOTE — Patient Instructions (Signed)
Victoria AddisonLinda Kaufman  02/08/2017   Your procedure is scheduled on: 02/16/17  [facility]  [place] [place] to 3rd floor to  Short Stay Center at AM.  Report to admitting at AM Follow signs to Short Stay on first floor at     0515 AM  Call this number if you have problems the morning of surgery  [phone]   Remember: ONLY 1 PERSON MAY GO WITH YOU TO SHORT STAY TO GET  READY MORNING OF YOUR SURGERY.  Do not eat food or drink liquids :After Midnight.     Take these medicines the morning of surgery with A SIP OF WATER: NONE                                You may not have any metal on your body including hair pins and              piercings  Do not wear jewelry, make-up, lotions, powders or perfumes, deodorant             Do not wear nail polish.  Do not shave  48 hours prior to surgery.             Do not bring valuables to the hospital. [facility] IS NOT             RESPONSIBLE   FOR VALUABLES.  Contacts, dentures or bridgework may not be worn into surgery.  Leave suitcase in the car. After surgery it may be brought to your room.                  Please read over the following fact sheets you were given: _____________________________________________________________________           [facility] Preparing [facility] Before surgery, you can play an important role.  Because skin is not sterile, your skin needs to be as free of germs as possible.  You can reduce the number of germs on your skin by washing with CHG (chlorahexidine gluconate) soap before surgery.  CHG is an antiseptic cleaner which kills germs and bonds with the skin to continue killing germs even after washing. Please DO NOT use if you have an allergy to CHG or antibacterial soaps.  If your skin becomes reddened/irritated stop using the CHG and inform your nurse when you arrive at Short Stay. Do not shave (including legs and underarms) for at least 48 hours prior to the  first CHG shower.  You may shave your face/neck. Please follow these instructions carefully:  1.  Shower with CHG Soap the night before surgery and the  morning of Surgery.  2.  If you choose to wash your hair, wash your hair first as usual with your  normal  shampoo.  3.  After you shampoo, rinse your hair and body thoroughly to remove the  shampoo.                           4.  Use CHG as you would any other liquid soap.  You can apply chg directly  to the skin and wash                       Gently with a scrungie  or clean washcloth.  5.  Apply the CHG Soap to your body ONLY FROM THE NECK DOWN.   Do not use on face/ open                           Wound or open sores. Avoid contact with eyes, ears mouth and genitals (private parts).                       Wash face,  Genitals (private parts) with your normal soap.             6.  Wash thoroughly, paying special attention to the area where your surgery  will be performed.  7.  Thoroughly rinse your body with warm water from the neck down.  8.  DO NOT shower/wash with your normal soap after using and rinsing off  the CHG Soap.                9.  Pat yourself dry with a clean towel.            10.  Wear clean pajamas.            11.  Place clean sheets on your bed the night of your first shower and do not  sleep with pets. Day of Surgery : Do not apply any lotions/deodorants the morning of surgery.  Please wear clean clothes to the hospital/surgery center.  FAILURE TO FOLLOW THESE INSTRUCTIONS MAY RESULT IN THE CANCELLATION OF YOUR SURGERY PATIENT SIGNATURE_________________________________  NURSE SIGNATURE__________________________________  ________________________________________________________________________  WHAT IS A BLOOD TRANSFUSION? Blood Transfusion Information  A transfusion is the replacement of blood or some of its parts. Blood is made up of multiple cells which provide different functions.  Red blood cells carry oxygen and are  used for blood loss replacement.  White blood cells fight against infection.  Platelets control bleeding.  Plasma helps clot blood.  Other blood products are available for specialized needs, such as hemophilia or other clotting disorders. BEFORE THE TRANSFUSION  Who gives blood for transfusions?   Healthy volunteers who are fully evaluated to make sure their blood is safe. This is blood bank blood. Transfusion therapy is the safest it has ever been in the practice of medicine. Before blood is taken from a donor, a complete history is taken to make sure that person has no history of diseases nor engages in risky social behavior (examples are intravenous drug use or sexual activity with multiple partners). The donor's travel history is screened to minimize risk of transmitting infections, such as malaria. The donated blood is tested for signs of infectious diseases, such as HIV and hepatitis. The blood is then tested to be sure it is compatible with you in order to minimize the chance of a transfusion reaction. If you or a relative donates blood, this is often done in anticipation of surgery and is not appropriate for emergency situations. It takes many days to process the donated blood. RISKS AND COMPLICATIONS Although transfusion therapy is very safe and saves many lives, the main dangers of transfusion include:   Getting an infectious disease.  Developing a transfusion reaction. This is an allergic reaction to something in the blood you were given. Every precaution is taken to prevent this. The decision to have a blood transfusion has been considered carefully by your caregiver before blood is given. Blood is not given unless the benefits outweigh the risks. AFTER THE  TRANSFUSION  Right after receiving a blood transfusion, you will usually feel much better and more energetic. This is especially true if your red blood cells have gotten low (anemic). The transfusion raises the level of the red  blood cells which carry oxygen, and this usually causes an energy increase.  The nurse administering the transfusion will monitor you carefully for complications. HOME CARE INSTRUCTIONS  No special instructions are needed after a transfusion. You may find your energy is better. Speak with your caregiver about any limitations on activity for underlying diseases you may have. SEEK MEDICAL CARE IF:   Your condition is not improving after your transfusion.  You develop redness or irritation at the intravenous (IV) site. SEEK IMMEDIATE MEDICAL CARE IF:  Any of the following symptoms occur over the next 12 hours:  Shaking chills.  You have a temperature by mouth above 102 F (38.9 C), not controlled by medicine.  Chest, back, or muscle pain.  People around you feel you are not acting correctly or are confused.  Shortness of breath or difficulty breathing.  Dizziness and fainting.  You get a rash or develop hives.  You have a decrease in urine output.  Your urine turns a dark color or changes to pink, red, or brown. Any of the following symptoms occur over the next 10 days:  You have a temperature by mouth above 102 F (38.9 C), not controlled by medicine.  Shortness of breath.  Weakness after normal activity.  The white part of the eye turns yellow (jaundice).  You have a decrease in the amount of urine or are urinating less often.  Your urine turns a dark color or changes to pink, red, or brown. Document Released: 06/23/2000 Document Revised: 09/18/2011 Document Reviewed: 02/10/2008 ExitCare Patient Information 2014 [place].  _______________________________________________________________________  Incentive Spirometer  An incentive spirometer is a tool that can help keep your lungs clear and active. This tool measures how well you are filling your lungs with each breath. Taking long deep breaths may help reverse or decrease the chance of developing breathing  (pulmonary) problems (especially infection) following:  A long period of time when you are unable to move or be active. BEFORE THE PROCEDURE   If the spirometer includes an indicator to show your best effort, your nurse or respiratory therapist will set it to a desired goal.  If possible, sit up straight or lean slightly forward. Try not to slouch.  Hold the incentive spirometer in an upright position. INSTRUCTIONS FOR USE  1. Sit on the edge of your bed if possible, or sit up as far as you can in bed or on a chair. 2. Hold the incentive spirometer in an upright position. 3. Breathe out normally. 4. Place the mouthpiece in your mouth and seal your lips tightly around it. 5. Breathe in slowly and as deeply as possible, raising the piston or the ball toward the top of the column. 6. Hold your breath for 3-5 seconds or for as long as possible. Allow the piston or ball to fall to the bottom of the column. 7. Remove the mouthpiece from your mouth and breathe out normally. 8. Rest for a few seconds and repeat Steps 1 through 7 at least 10 times every 1-2 hours when you are awake. Take your time and take a few normal breaths between deep breaths. 9. The spirometer may include an indicator to show your best effort. Use the indicator as a goal to work toward during each repetition.  10. After each set of 10 deep breaths, practice coughing to be sure your lungs are clear. If you have an incision (the cut made at the time of surgery), support your incision when coughing by placing a pillow or rolled up towels firmly against it. Once you are able to get out of bed, walk around indoors and cough well. You may stop using the incentive spirometer when instructed by your caregiver.  RISKS AND COMPLICATIONS  Take your time so you do not get dizzy or light-headed.  If you are in pain, you may need to take or ask for pain medication before doing incentive spirometry. It is harder to take a deep breath if you  are having pain. AFTER USE  Rest and breathe slowly and easily.  It can be helpful to keep track of a log of your progress. Your caregiver can provide you with a simple table to help with this. If you are using the spirometer at home, follow these instructions: [place] IF:   You are having difficultly using the spirometer.  You have trouble using the spirometer as often as instructed.  Your pain medication is not giving enough relief while using the spirometer.  You develop fever of 100.5 F (38.1 C) or higher. SEEK IMMEDIATE MEDICAL CARE IF:   You cough up bloody sputum that had not been present before.  You develop fever of 102 F (38.9 C) or greater.  You develop worsening pain at or near the incision site. MAKE SURE YOU:   Understand these instructions.  Will watch your condition.  Will get help right away if you are not doing well or get worse. Document Released: 11/06/2006 Document Revised: 09/18/2011 Document Reviewed: 01/07/2007 [facility] Patient Information 2014 [place], [place].   ________________________________________________________________________

## 2017-02-09 ENCOUNTER — Encounter (HOSPITAL_COMMUNITY)
Admission: RE | Admit: 2017-02-09 | Discharge: 2017-02-09 | Disposition: A | Discharge status: Home / Self Care | Payer: BLUE CROSS/BLUE SHIELD | Source: Ambulatory Visit | Attending: Orthopaedic Surgery | Admitting: Orthopaedic Surgery

## 2017-02-09 ENCOUNTER — Encounter (HOSPITAL_COMMUNITY): Payer: Self-pay

## 2017-02-09 DIAGNOSIS — M1612 Unilateral primary osteoarthritis, left hip: Secondary | ICD-10-CM | POA: Insufficient documentation

## 2017-02-09 DIAGNOSIS — Z01818 Encounter for other preprocedural examination: Secondary | ICD-10-CM | POA: Diagnosis present

## 2017-02-09 DIAGNOSIS — R001 Bradycardia, unspecified: Secondary | ICD-10-CM | POA: Diagnosis not present

## 2017-02-09 DIAGNOSIS — Z01810 Encounter for preprocedural cardiovascular examination: Secondary | ICD-10-CM | POA: Diagnosis not present

## 2017-02-09 HISTORY — DX: Headache, unspecified: R51.9

## 2017-02-09 HISTORY — DX: Pneumonia, unspecified organism: J18.9

## 2017-02-09 HISTORY — DX: Headache: R51

## 2017-02-09 HISTORY — DX: Unspecified osteoarthritis, unspecified site: M19.90

## 2017-02-09 LAB — CBC
HEMATOCRIT: 36 % (ref 36.0–46.0)
Hemoglobin: 12.2 g/dL (ref 12.0–15.0)
MCH: 27.5 pg (ref 26.0–34.0)
MCHC: 33.9 g/dL (ref 30.0–36.0)
MCV: 81.3 fL (ref 78.0–100.0)
Platelets: 211 10*3/uL (ref 150–400)
RBC: 4.43 MIL/uL (ref 3.87–5.11)
RDW: 13.8 % (ref 11.5–15.5)
WBC: 4.6 10*3/uL (ref 4.0–10.5)

## 2017-02-09 LAB — BASIC METABOLIC PANEL
Anion gap: 7 (ref 5–15)
BUN: 16 mg/dL (ref 6–20)
CHLORIDE: 107 mmol/L (ref 101–111)
CO2: 27 mmol/L (ref 22–32)
Calcium: 9.2 mg/dL (ref 8.9–10.3)
Creatinine, Ser: 0.57 mg/dL (ref 0.44–1.00)
GFR calc non Af Amer: >60 mL/min (ref >60)
Glucose, Bld: 83 mg/dL (ref 65–99)
POTASSIUM: 3.9 mmol/L (ref 3.5–5.1)
SODIUM: 141 mmol/L (ref 135–145)

## 2017-02-09 LAB — SURGICAL PCR SCREEN
MRSA, PCR: NEGATIVE
STAPHYLOCOCCUS AUREUS: POSITIVE — AB

## 2017-02-09 LAB — HCG, SERUM, QUALITATIVE: PREG SERUM: NEGATIVE

## 2017-02-09 LAB — ABO/RH: ABO/RH(D): A POS

## 2017-02-09 NOTE — Progress Notes (Signed)
FYI pt. In for preop appt. for surgery 02/16/17 Left hip. Pt. Does not have a history of HTN but  Had a BP of 184/91 R arm and 191/91 Left arm. She states she is in pain and out of her pain medicine. She rates her pain at a 10.I did an EKG per anesthesia guidelines that will be able to be seen in epic. Thank you!

## 2017-02-15 MED ORDER — DEXTROSE 5 % IV SOLN
3.0000 g | INTRAVENOUS | Status: AC
  Administered 2017-02-16: 3 g via INTRAVENOUS
  Filled 2017-02-15: qty 3

## 2017-02-15 NOTE — Anesthesia Preprocedure Evaluation (Addendum)
Anesthesia Evaluation  Patient identified by MRN, date of birth, ID band Patient awake    Reviewed: Allergy & Precautions, NPO status , Patient's Chart, lab work & pertinent test results  Airway Mallampati: I  TM Distance: >3 FB Neck ROM: Full    Dental no notable dental hx. (+) Dental Advisory Given   Pulmonary pneumonia, resolved,    Pulmonary exam normal breath sounds clear to auscultation       Cardiovascular negative cardio ROS Normal cardiovascular exam Rhythm:Regular Rate:Normal     Neuro/Psych  Headaches, negative psych ROS   GI/Hepatic negative GI ROS, Neg liver ROS,   Endo/Other  Morbid obesity  Renal/GU negative Renal ROS  negative genitourinary   Musculoskeletal  (+) Arthritis , Osteoarthritis,    Abdominal (+) + obese,   Peds  Hematology negative hematology ROS (+)   Anesthesia Other Findings   Reproductive/Obstetrics negative OB ROS                          Anesthesia Physical Anesthesia Plan  ASA: III  Anesthesia Plan: General   Post-op Pain Management:    Induction: Intravenous  PONV Risk Score and Plan: 3 and Ondansetron, Dexamethasone, Midazolam and Treatment may vary due to age or medical condition  Airway Management Planned: Oral ETT  Additional Equipment:   Intra-op Plan:   Post-operative Plan:   Informed Consent: I have reviewed the patients History and Physical, chart, labs and discussed the procedure including the risks, benefits and alternatives for the proposed anesthesia with the patient or authorized representative who has indicated his/her understanding and acceptance.   Dental advisory given  Plan Discussed with: CRNA  Anesthesia Plan Comments: (Patient offered spinal anesthetic, refused due to personal concerns, opts for general anesthesia.)     Anesthesia Quick Evaluation

## 2017-02-16 ENCOUNTER — Inpatient Hospital Stay (HOSPITAL_COMMUNITY): Payer: BLUE CROSS/BLUE SHIELD

## 2017-02-16 ENCOUNTER — Inpatient Hospital Stay (HOSPITAL_COMMUNITY): Payer: BLUE CROSS/BLUE SHIELD | Admitting: Anesthesiology

## 2017-02-16 ENCOUNTER — Inpatient Hospital Stay (HOSPITAL_COMMUNITY)
Admission: RE | Admit: 2017-02-16 | Discharge: 2017-02-19 | DRG: 470 | Disposition: A | Discharge status: Home Health | Payer: BLUE CROSS/BLUE SHIELD | Source: Ambulatory Visit | Attending: Orthopaedic Surgery | Admitting: Orthopaedic Surgery

## 2017-02-16 ENCOUNTER — Encounter (HOSPITAL_COMMUNITY): Payer: Self-pay | Admitting: *Deleted

## 2017-02-16 ENCOUNTER — Encounter (HOSPITAL_COMMUNITY)
Admission: RE | Disposition: A | Discharge status: Home Health | Payer: Self-pay | Source: Ambulatory Visit | Attending: Orthopaedic Surgery

## 2017-02-16 DIAGNOSIS — Z419 Encounter for procedure for purposes other than remedying health state, unspecified: Secondary | ICD-10-CM

## 2017-02-16 DIAGNOSIS — M1612 Unilateral primary osteoarthritis, left hip: Secondary | ICD-10-CM | POA: Diagnosis present

## 2017-02-16 DIAGNOSIS — Z6841 Body Mass Index (BMI) 40.0 and over, adult: Secondary | ICD-10-CM

## 2017-02-16 DIAGNOSIS — Z79899 Other long term (current) drug therapy: Secondary | ICD-10-CM | POA: Diagnosis not present

## 2017-02-16 DIAGNOSIS — Z96642 Presence of left artificial hip joint: Secondary | ICD-10-CM

## 2017-02-16 DIAGNOSIS — M25552 Pain in left hip: Secondary | ICD-10-CM | POA: Diagnosis present

## 2017-02-16 HISTORY — DX: Personal history of pneumonia (recurrent): Z87.01

## 2017-02-16 HISTORY — PX: TOTAL HIP ARTHROPLASTY: SHX124

## 2017-02-16 LAB — TYPE AND SCREEN
ABO/RH(D): A POS
Antibody Screen: NEGATIVE

## 2017-02-16 SURGERY — ARTHROPLASTY, HIP, TOTAL, ANTERIOR APPROACH
Anesthesia: General | Site: Hip | Laterality: Left

## 2017-02-16 MED ORDER — ACETAMINOPHEN 650 MG RE SUPP: 650.0000 mg | Freq: Four times a day (QID) | RECTAL | Status: DC | PRN

## 2017-02-16 MED ORDER — MIDAZOLAM HCL 2 MG/2ML IJ SOLN
INTRAMUSCULAR | Status: DC | PRN
  Administered 2017-02-16: 2 mg via INTRAVENOUS

## 2017-02-16 MED ORDER — METOCLOPRAMIDE HCL 5 MG PO TABS
5.0000 mg | ORAL_TABLET | Freq: Three times a day (TID) | ORAL | Status: DC | PRN
Start: 2017-02-16 — End: 2017-02-19

## 2017-02-16 MED ORDER — SUGAMMADEX SODIUM 200 MG/2ML IV SOLN
INTRAVENOUS | Status: DC | PRN
  Administered 2017-02-16: 250 mg via INTRAVENOUS

## 2017-02-16 MED ORDER — ONDANSETRON HCL 4 MG/2ML IJ SOLN
INTRAMUSCULAR | Status: AC
  Filled 2017-02-16: qty 2

## 2017-02-16 MED ORDER — SODIUM CHLORIDE 0.9 % IR SOLN
Status: DC | PRN
  Administered 2017-02-16: 1000 mL

## 2017-02-16 MED ORDER — CELECOXIB 200 MG PO CAPS
200.0000 mg | ORAL_CAPSULE | Freq: Two times a day (BID) | ORAL | Status: DC
  Administered 2017-02-16 – 2017-02-19 (×6): 200 mg via ORAL
  Filled 2017-02-16 (×6): qty 1

## 2017-02-16 MED ORDER — LIDOCAINE 2% (20 MG/ML) 5 ML SYRINGE
INTRAMUSCULAR | Status: DC | PRN
  Administered 2017-02-16: 100 mg via INTRAVENOUS

## 2017-02-16 MED ORDER — ROCURONIUM BROMIDE 10 MG/ML (PF) SYRINGE
PREFILLED_SYRINGE | INTRAVENOUS | Status: DC | PRN
  Administered 2017-02-16: 10 mg via INTRAVENOUS
  Administered 2017-02-16: 50 mg via INTRAVENOUS
  Administered 2017-02-16: 10 mg via INTRAVENOUS

## 2017-02-16 MED ORDER — TRANEXAMIC ACID 1000 MG/10ML IV SOLN
1000.0000 mg | INTRAVENOUS | Status: AC
  Administered 2017-02-16: 1000 mg via INTRAVENOUS
  Filled 2017-02-16: qty 1100

## 2017-02-16 MED ORDER — DOCUSATE SODIUM 100 MG PO CAPS
100.0000 mg | ORAL_CAPSULE | Freq: Two times a day (BID) | ORAL | Status: DC
  Administered 2017-02-16 – 2017-02-19 (×6): 100 mg via ORAL
  Filled 2017-02-16 (×6): qty 1

## 2017-02-16 MED ORDER — ROCURONIUM BROMIDE 50 MG/5ML IV SOSY
PREFILLED_SYRINGE | INTRAVENOUS | Status: AC
  Filled 2017-02-16: qty 5

## 2017-02-16 MED ORDER — HYDROMORPHONE HCL-NACL 0.5-0.9 MG/ML-% IV SOSY
PREFILLED_SYRINGE | INTRAVENOUS | Status: AC
  Filled 2017-02-16: qty 1

## 2017-02-16 MED ORDER — POLYETHYLENE GLYCOL 3350 17 G PO PACK: 17.0000 g | PACK | Freq: Every day | ORAL | Status: DC | PRN

## 2017-02-16 MED ORDER — FENTANYL CITRATE (PF) 100 MCG/2ML IJ SOLN
25.0000 ug | INTRAMUSCULAR | Status: DC | PRN
  Administered 2017-02-16 (×3): 50 ug via INTRAVENOUS

## 2017-02-16 MED ORDER — FENTANYL CITRATE (PF) 100 MCG/2ML IJ SOLN
INTRAMUSCULAR | Status: AC
  Filled 2017-02-16: qty 2

## 2017-02-16 MED ORDER — METHOCARBAMOL 500 MG PO TABS
500.0000 mg | ORAL_TABLET | Freq: Four times a day (QID) | ORAL | Status: DC | PRN
  Administered 2017-02-16 – 2017-02-18 (×5): 500 mg via ORAL
  Filled 2017-02-16 (×5): qty 1

## 2017-02-16 MED ORDER — METOCLOPRAMIDE HCL 5 MG/ML IJ SOLN: 5.0000 mg | Freq: Three times a day (TID) | INTRAMUSCULAR | Status: DC | PRN

## 2017-02-16 MED ORDER — ACETAMINOPHEN 325 MG PO TABS: 650.0000 mg | ORAL_TABLET | Freq: Four times a day (QID) | ORAL | Status: DC | PRN

## 2017-02-16 MED ORDER — HYDROMORPHONE HCL-NACL 0.5-0.9 MG/ML-% IV SOSY
1.0000 mg | PREFILLED_SYRINGE | INTRAVENOUS | Status: DC | PRN
  Administered 2017-02-16: 11:00:00 1 mg via INTRAVENOUS
  Filled 2017-02-16: qty 2

## 2017-02-16 MED ORDER — SODIUM CHLORIDE 0.9 % IV SOLN
INTRAVENOUS | Status: DC
  Administered 2017-02-16 – 2017-02-17 (×2): via INTRAVENOUS

## 2017-02-16 MED ORDER — FENTANYL CITRATE (PF) 100 MCG/2ML IJ SOLN
INTRAMUSCULAR | Status: AC
  Administered 2017-02-16: 10:00:00
  Filled 2017-02-16: qty 4

## 2017-02-16 MED ORDER — PHENTERMINE HCL 30 MG PO CAPS
30.0000 mg | ORAL_CAPSULE | Freq: Every day | ORAL | Status: DC
Start: 2017-02-16 — End: 2017-02-16

## 2017-02-16 MED ORDER — BISACODYL 10 MG RE SUPP: 10.0000 mg | Freq: Every day | RECTAL | Status: DC | PRN

## 2017-02-16 MED ORDER — DEXAMETHASONE SODIUM PHOSPHATE 10 MG/ML IJ SOLN
INTRAMUSCULAR | Status: AC
  Filled 2017-02-16: qty 1

## 2017-02-16 MED ORDER — ONDANSETRON HCL 4 MG/2ML IJ SOLN: 4.0000 mg | Freq: Four times a day (QID) | INTRAMUSCULAR | Status: DC | PRN

## 2017-02-16 MED ORDER — DIPHENHYDRAMINE HCL 12.5 MG/5ML PO ELIX: 12.5000 mg | ORAL_SOLUTION | ORAL | Status: DC | PRN

## 2017-02-16 MED ORDER — CEFAZOLIN SODIUM-DEXTROSE 2-4 GM/100ML-% IV SOLN
2.0000 g | Freq: Four times a day (QID) | INTRAVENOUS | Status: AC
  Administered 2017-02-16 (×2): 2 g via INTRAVENOUS
  Filled 2017-02-16 (×2): qty 100

## 2017-02-16 MED ORDER — ALUM & MAG HYDROXIDE-SIMETH 200-200-20 MG/5ML PO SUSP: 30.0000 mL | ORAL | Status: DC | PRN

## 2017-02-16 MED ORDER — ONDANSETRON HCL 4 MG/2ML IJ SOLN
INTRAMUSCULAR | Status: DC | PRN
  Administered 2017-02-16: 4 mg via INTRAVENOUS

## 2017-02-16 MED ORDER — SODIUM CHLORIDE 0.9 % IJ SOLN
INTRAMUSCULAR | Status: AC
  Filled 2017-02-16: qty 10

## 2017-02-16 MED ORDER — PHENOL 1.4 % MT LIQD: 1.0000 | OROMUCOSAL | Status: DC | PRN

## 2017-02-16 MED ORDER — PROPOFOL 10 MG/ML IV BOLUS
INTRAVENOUS | Status: AC
  Filled 2017-02-16: qty 60

## 2017-02-16 MED ORDER — LABETALOL HCL 5 MG/ML IV SOLN
INTRAVENOUS | Status: AC
  Filled 2017-02-16: qty 4

## 2017-02-16 MED ORDER — MENTHOL 3 MG MT LOZG: 1.0000 | LOZENGE | OROMUCOSAL | Status: DC | PRN

## 2017-02-16 MED ORDER — DEXAMETHASONE SODIUM PHOSPHATE 10 MG/ML IJ SOLN
INTRAMUSCULAR | Status: DC | PRN
  Administered 2017-02-16: 10 mg via INTRAVENOUS

## 2017-02-16 MED ORDER — LABETALOL HCL 5 MG/ML IV SOLN
INTRAVENOUS | Status: DC | PRN
  Administered 2017-02-16 (×4): 5 mg via INTRAVENOUS

## 2017-02-16 MED ORDER — SUGAMMADEX SODIUM 500 MG/5ML IV SOLN
INTRAVENOUS | Status: AC
  Filled 2017-02-16: qty 5

## 2017-02-16 MED ORDER — OXYCODONE HCL 5 MG PO TABS
5.0000 mg | ORAL_TABLET | ORAL | Status: DC | PRN
  Administered 2017-02-16 – 2017-02-19 (×18): 10 mg via ORAL
  Filled 2017-02-16 (×19): qty 2

## 2017-02-16 MED ORDER — LIDOCAINE 2% (20 MG/ML) 5 ML SYRINGE
INTRAMUSCULAR | Status: AC
  Filled 2017-02-16: qty 5

## 2017-02-16 MED ORDER — HYDRALAZINE HCL 20 MG/ML IJ SOLN
INTRAMUSCULAR | Status: DC | PRN
  Administered 2017-02-16: 5 mg via INTRAVENOUS

## 2017-02-16 MED ORDER — ASPIRIN 81 MG PO CHEW
81.0000 mg | CHEWABLE_TABLET | Freq: Two times a day (BID) | ORAL | Status: DC
  Administered 2017-02-16 – 2017-02-19 (×6): 81 mg via ORAL
  Filled 2017-02-16 (×6): qty 1

## 2017-02-16 MED ORDER — METHOCARBAMOL 1000 MG/10ML IJ SOLN
500.0000 mg | Freq: Four times a day (QID) | INTRAVENOUS | Status: DC | PRN
  Administered 2017-02-16: 500 mg via INTRAVENOUS
  Filled 2017-02-16: qty 550

## 2017-02-16 MED ORDER — SUCCINYLCHOLINE CHLORIDE 200 MG/10ML IV SOSY
PREFILLED_SYRINGE | INTRAVENOUS | Status: AC
  Filled 2017-02-16: qty 10

## 2017-02-16 MED ORDER — HYDRALAZINE HCL 20 MG/ML IJ SOLN
INTRAMUSCULAR | Status: AC
  Filled 2017-02-16: qty 1

## 2017-02-16 MED ORDER — SUCCINYLCHOLINE CHLORIDE 200 MG/10ML IV SOSY
PREFILLED_SYRINGE | INTRAVENOUS | Status: DC | PRN
  Administered 2017-02-16: 160 mg via INTRAVENOUS

## 2017-02-16 MED ORDER — HYDROMORPHONE HCL-NACL 0.5-0.9 MG/ML-% IV SOSY
PREFILLED_SYRINGE | INTRAVENOUS | Status: AC
  Administered 2017-02-16: 11:00:00
  Filled 2017-02-16: qty 1

## 2017-02-16 MED ORDER — ONDANSETRON HCL 4 MG PO TABS: 4.0000 mg | ORAL_TABLET | Freq: Four times a day (QID) | ORAL | Status: DC | PRN

## 2017-02-16 MED ORDER — PROPOFOL 10 MG/ML IV BOLUS
INTRAVENOUS | Status: DC | PRN
Start: 2017-02-16 — End: 2017-02-16
  Administered 2017-02-16: 200 mg via INTRAVENOUS

## 2017-02-16 MED ORDER — CHLORHEXIDINE GLUCONATE 4 % EX LIQD: 60.0000 mL | Freq: Once | CUTANEOUS | Status: DC

## 2017-02-16 MED ORDER — MIDAZOLAM HCL 2 MG/2ML IJ SOLN
INTRAMUSCULAR | Status: AC
  Filled 2017-02-16: qty 2

## 2017-02-16 MED ORDER — PROMETHAZINE HCL 25 MG/ML IJ SOLN: 6.2500 mg | INTRAMUSCULAR | Status: DC | PRN

## 2017-02-16 MED ORDER — LACTATED RINGERS IV SOLN
INTRAVENOUS | Status: DC
  Administered 2017-02-16 (×3): via INTRAVENOUS

## 2017-02-16 MED ORDER — HYDROMORPHONE HCL-NACL 0.5-0.9 MG/ML-% IV SOSY
0.2500 mg | PREFILLED_SYRINGE | INTRAVENOUS | Status: DC | PRN
  Administered 2017-02-16: 0.5 mg via INTRAVENOUS

## 2017-02-16 MED ORDER — FENTANYL CITRATE (PF) 100 MCG/2ML IJ SOLN
INTRAMUSCULAR | Status: DC | PRN
  Administered 2017-02-16 (×4): 50 ug via INTRAVENOUS
  Administered 2017-02-16 (×2): 100 ug via INTRAVENOUS

## 2017-02-16 SURGICAL SUPPLY — 40 items
APL SKNCLS STERI-STRIP NONHPOA (GAUZE/BANDAGES/DRESSINGS)
BAG SPEC THK2 15X12 ZIP CLS (MISCELLANEOUS)
BAG ZIPLOCK 12X15 (MISCELLANEOUS) IMPLANT
BENZOIN TINCTURE PRP APPL 2/3 (GAUZE/BANDAGES/DRESSINGS) IMPLANT
BLADE SAW SGTL 18X1.27X75 (BLADE) ×2 IMPLANT
BLADE SAW SGTL 18X1.27X75MM (BLADE) ×1
CAPT HIP TOTAL 2 ×2 IMPLANT
CELLS DAT CNTRL 66122 CELL SVR (MISCELLANEOUS) ×1 IMPLANT
CLOSURE WOUND 1/2 X4 (GAUZE/BANDAGES/DRESSINGS)
COVER PERINEAL POST (MISCELLANEOUS) ×3 IMPLANT
COVER SURGICAL LIGHT HANDLE (MISCELLANEOUS) ×3 IMPLANT
DRAPE STERI IOBAN 125X83 (DRAPES) ×3 IMPLANT
DRAPE U-SHAPE 47X51 STRL (DRAPES) ×6 IMPLANT
DRSG AQUACEL AG ADV 3.5X10 (GAUZE/BANDAGES/DRESSINGS) ×3 IMPLANT
DURAPREP 26ML APPLICATOR (WOUND CARE) ×3 IMPLANT
ELECT REM PT RETURN 15FT ADLT (MISCELLANEOUS) ×3 IMPLANT
GAUZE XEROFORM 1X8 LF (GAUZE/BANDAGES/DRESSINGS) ×2 IMPLANT
GLOVE BIO SURGEON STRL SZ7.5 (GLOVE) ×3 IMPLANT
GLOVE BIOGEL PI IND STRL 7.5 (GLOVE) IMPLANT
GLOVE BIOGEL PI IND STRL 8 (GLOVE) ×2 IMPLANT
GLOVE BIOGEL PI INDICATOR 7.5 (GLOVE) ×8
GLOVE BIOGEL PI INDICATOR 8 (GLOVE) ×4
GLOVE ECLIPSE 8.0 STRL XLNG CF (GLOVE) ×3 IMPLANT
GOWN STRL REUS W/TWL XL LVL3 (GOWN DISPOSABLE) ×10 IMPLANT
HANDPIECE INTERPULSE COAX TIP (DISPOSABLE) ×3
HOLDER FOLEY CATH W/STRAP (MISCELLANEOUS) ×3 IMPLANT
PACK ANTERIOR HIP CUSTOM (KITS) ×3 IMPLANT
RETRACTOR WND ALEXIS 18 MED (MISCELLANEOUS) ×1 IMPLANT
RTRCTR WOUND ALEXIS 18CM MED (MISCELLANEOUS) ×3
SET HNDPC FAN SPRY TIP SCT (DISPOSABLE) ×1 IMPLANT
STAPLER VISISTAT 35W (STAPLE) ×2 IMPLANT
STRIP CLOSURE SKIN 1/2X4 (GAUZE/BANDAGES/DRESSINGS) IMPLANT
SUT ETHIBOND NAB CT1 #1 30IN (SUTURE) ×3 IMPLANT
SUT MNCRL AB 4-0 PS2 18 (SUTURE) IMPLANT
SUT VIC AB 0 CT1 36 (SUTURE) ×3 IMPLANT
SUT VIC AB 1 CT1 36 (SUTURE) ×3 IMPLANT
SUT VIC AB 2-0 CT1 27 (SUTURE) ×6
SUT VIC AB 2-0 CT1 TAPERPNT 27 (SUTURE) ×2 IMPLANT
TRAY FOLEY CATH 14FR (SET/KITS/TRAYS/PACK) ×2 IMPLANT
YANKAUER SUCT BULB TIP 10FT TU (MISCELLANEOUS) ×3 IMPLANT

## 2017-02-16 NOTE — Anesthesia Procedure Notes (Signed)
Procedure Name: Intubation Date/Time: 02/16/2017 7:27 AM Performed by: Leroy LibmanEARDON, Enedina Pair L Patient Re-evaluated:Patient Re-evaluated prior to induction Oxygen Delivery Method: Circle system utilized Preoxygenation: Pre-oxygenation with 100% oxygen Induction Type: IV induction Ventilation: Mask ventilation without difficulty and Oral airway inserted - appropriate to patient size Laryngoscope Size: Miller and 3 Grade View: Grade I Tube size: 7.5 mm Number of attempts: 1 Airway Equipment and Method: Stylet Placement Confirmation: ETT inserted through vocal cords under direct vision and positive ETCO2 Secured at: 22 cm Tube secured with: Tape Dental Injury: Teeth and Oropharynx as per pre-operative assessment

## 2017-02-16 NOTE — Brief Op Note (Signed)
02/16/2017  8:55 AM  PATIENT:  Victoria AddisonLinda Coba  46 y.o. female  PRE-OPERATIVE DIAGNOSIS:  left hip severe endstage arthritis  POST-OPERATIVE DIAGNOSIS:  left hip severe endstage arthritis  PROCEDURE:  Procedure(s): LEFT TOTAL HIP ARTHROPLASTY ANTERIOR APPROACH (Left)  SURGEON:  Surgeon(s) and Role:    Kathryne Hitch* Gwenith Tschida Y, MD - Primary  PHYSICIAN ASSISTANT: Rexene EdisonGil Clark, PA-C  ANESTHESIA:   general  EBL:  Total I/O In: 1000 [I.V.:1000] Out: 1300 [Urine:550; Blood:750]  COUNTS:  YES  DICTATION: .Other Dictation: Dictation Number [id number]  PLAN OF CARE: Admit to inpatient   PATIENT DISPOSITION:  PACU - hemodynamically stable.   Delay start of Pharmacological VTE agent (>24hrs) due to surgical blood loss or risk of bleeding: no

## 2017-02-16 NOTE — Anesthesia Postprocedure Evaluation (Signed)
Anesthesia Post Note  Patient: Victoria AddisonLinda Kaufman  Procedure(s) Performed: Procedure(s) (LRB): LEFT TOTAL HIP ARTHROPLASTY ANTERIOR APPROACH (Left)     Patient location during evaluation: PACU Anesthesia Type: General Level of consciousness: awake and alert Pain management: pain level controlled Vital Signs Assessment: post-procedure vital signs reviewed and stable Respiratory status: spontaneous breathing, nonlabored ventilation and respiratory function stable Cardiovascular status: blood pressure returned to baseline and stable Postop Assessment: no signs of nausea or vomiting Anesthetic complications: no    Last Vitals:  Vitals:   02/16/17 1045 02/16/17 1100  BP: (!) 169/97 (!) 172/87  Pulse: 72 71  Resp: 20 18  Temp: 36.4 C 36.4 C  SpO2: 100% 100%    Last Pain:  Vitals:   02/16/17 1045  TempSrc:   PainSc: 5                  Beryle Lathehomas E Dyanna Seiter

## 2017-02-16 NOTE — Evaluation (Signed)
Physical Therapy Evaluation Patient Details Name: Victoria Kaufman MRN: [id number] DOB: 08/20/1970 Today's Date: 02/16/2017   History of Present Illness  Pt s/p L THR  Clinical Impression  Pt s/p L THR and presents with decreased L LE strength/ROM, post op pain, and obesity limiting functional mobility.  Pt should progress to dc home with family assist.    Follow Up Recommendations Home health PT    Equipment Recommendations  Rolling walker with 5" wheels (Pt is 5'1 and 291 lbs)    Recommendations for Other Services OT consult     Precautions / Restrictions Precautions Precautions: Fall Restrictions Weight Bearing Restrictions: No LLE Weight Bearing: Weight bearing as tolerated      Mobility  Bed Mobility Overal bed mobility: Needs Assistance Bed Mobility: Supine to Sit     Supine to sit: HOB elevated;Min assist;+2 for safety/equipment     General bed mobility comments: Increased time with cues for sequence and use of R LE to self assist.  HOB @ 45  Transfers Overall transfer level: Needs assistance Equipment used: Rolling walker (2 wheeled) Transfers: Sit to/from Stand Sit to Stand: Min assist;+2 safety/equipment;+2 physical assistance;From elevated surface         General transfer comment: cues for LE management and use of UEs to self assist  Ambulation/Gait Ambulation/Gait assistance: Min assist;+2 physical assistance;+2 safety/equipment Ambulation Distance (Feet): 48 Feet Assistive device: Rolling walker (2 wheeled) Gait Pattern/deviations: Step-to pattern;Decreased step length - right;Decreased step length - left;Shuffle;Trunk flexed Gait velocity: decr Gait velocity interpretation: Below normal speed for age/gender General Gait Details: cues for sequence, posture and position from [employer]            Wheelchair Mobility    Modified Rankin (Stroke Patients Only)       Balance                                              Pertinent Vitals/Pain Pain Assessment: 0-10 Pain Location: 4 Pain Descriptors / Indicators: Aching;Sore Pain Intervention(s): Limited activity within patient's tolerance;Monitored during session;Premedicated before session;Ice applied    Home Living Family/patient expects to be discharged to:: Private residence Living Arrangements: Spouse/significant other Available Help at Discharge: Family Type of Home: House Home Access: Level entry     Home Layout: Able to live on main level with bedroom/bathroom Home Equipment: Gilmer Mor - single point      Prior Function Level of Independence: Independent with assistive device(s)               Hand Dominance        Extremity/Trunk Assessment   Upper Extremity Assessment Upper Extremity Assessment: Overall WFL for tasks assessed    Lower Extremity Assessment Lower Extremity Assessment: LLE deficits/detail    Cervical / Trunk Assessment Cervical / Trunk Assessment: Normal  Communication   Communication: No difficulties  Cognition Arousal/Alertness: Awake/alert Behavior During Therapy: WFL for tasks assessed/performed Overall Cognitive Status: Within Functional Limits for tasks assessed                                        General Comments      Exercises     Assessment/Plan    PT Assessment Patient needs continued PT services  PT Problem List Decreased strength;Decreased range of motion;Decreased  activity tolerance;Decreased mobility;Decreased knowledge of use of DME;Obesity;Pain       PT Treatment Interventions DME instruction;Gait training;Stair training;Functional mobility training;Therapeutic activities;Therapeutic exercise;Patient/family education    PT Goals (Current goals can be found in the Care Plan section)  Acute Rehab PT Goals Patient Stated Goal: Regain IND PT Goal Formulation: With patient Time For Goal Achievement: 02/20/17 Potential to Achieve Goals: Good    Frequency  7X/week   Barriers to discharge        Co-evaluation               AM-PAC PT "6 Clicks" Daily Activity  Outcome Measure Difficulty turning over in bed (including adjusting bedclothes, sheets and blankets)?: Total Difficulty moving from lying on back to sitting on the side of the bed? : Total Difficulty sitting down on and standing up from a chair with arms (e.g., wheelchair, bedside commode, etc,.)?: Total Help needed moving to and from a bed to chair (including a wheelchair)?: A Little Help needed walking in hospital room?: A Little Help needed climbing 3-5 steps with a railing? : A Lot 6 Click Score: 11    End of Session   Activity Tolerance: Patient tolerated treatment well Patient left: in chair;with call bell/phone within reach Nurse Communication: Mobility status PT Visit Diagnosis: Difficulty in walking, not elsewhere classified (R26.2)    Time: [id number] PT Time Calculation (min) (ACUTE ONLY): 31 min   Charges:   PT Evaluation $PT Eval Low Complexity: 1 Low PT Treatments $Gait Training: 8-22 mins   PT G Codes:        Pg [phone]   Victoria Kaufman 02/16/2017, 5:44 PM

## 2017-02-16 NOTE — Transfer of Care (Signed)
Immediate Anesthesia Transfer of Care Note  Patient: Victoria AddisonLinda Kaufman  Procedure(s) Performed: Procedure(s): LEFT TOTAL HIP ARTHROPLASTY ANTERIOR APPROACH (Left)  Patient Location: PACU  Anesthesia Type:General  Level of Consciousness: awake, alert  and oriented  Airway & Oxygen Therapy: Patient Spontanous Breathing and Patient connected to face mask oxygen  Post-op Assessment: Report given to RN and Post -op Vital signs reviewed and stable  Post vital signs: Reviewed and stable  Last Vitals:  Vitals:   02/16/17 0522 02/16/17 0923  BP: (!) 164/97 (!) 171/94  Pulse: 74 82  Resp: 16 (P) 14  Temp: 36.8 C (P) 36.6 C  SpO2: 100% 100%    Last Pain:  Vitals:   02/16/17 0544  TempSrc:   PainSc: 10-Worst pain ever      Patients Stated Pain Goal: 4 (02/16/17 0544)  Complications: No apparent anesthesia complications

## 2017-02-16 NOTE — H&P (Signed)
TOTAL HIP ADMISSION H&P  Patient is admitted for left total hip arthroplasty.  Subjective:  Chief Complaint: left hip pain  HPI: Victoria Kaufman, 46 y.o. female, has a history of pain and functional disability in the left hip(s) due to arthritis and patient has failed non-surgical conservative treatments for greater than 12 weeks to include NSAID's and/or analgesics, corticosteriod injections, use of assistive devices, weight reduction as appropriate and activity modification.  Onset of symptoms was gradual starting 3 years ago with gradually worsening course since that time.The patient noted no past surgery on the left hip(s).  Patient currently rates pain in the left hip at 10 out of 10 with activity. Patient has night pain, worsening of pain with activity and weight bearing, trendelenberg gait, pain that interfers with activities of daily living and pain with passive range of motion. Patient has evidence of subchondral cysts, subchondral sclerosis, periarticular osteophytes and joint space narrowing by imaging studies. This condition presents safety issues increasing the risk of falls.  There is no current active infection.  Patient Active Problem List   Diagnosis Date Noted  . Pain in left hip 01/15/2017  . Unilateral primary osteoarthritis, left hip 01/15/2017   Past Medical History:  Diagnosis Date  . Arthritis   . Headache    hx of migraines 1 or 2 in life  . History of pneumonia   . Pneumonia     Past Surgical History:  Procedure Laterality Date  . CESAREAN SECTION    . CHOLECYSTECTOMY      Prescriptions Prior to Admission  Medication Sig Dispense Refill Last Dose  . COLLAGEN PO Take 1 capsule by mouth daily.   Past Month at Unknown time  . diclofenac sodium (VOLTAREN) 1 % GEL Apply 2 g topically 4 (four) times daily. (Patient taking differently: Apply 2 g topically 4 (four) times daily as needed (for pain.). ) 1 Tube 2 Past Month at Unknown time  . DUEXIS 800-26.6 MG TABS Take  1 tablet by mouth every 8 (eight) hours.  0 Past Week at Unknown time  . ibuprofen (ADVIL,MOTRIN) 200 MG tablet Take 800 mg by mouth every 8 (eight) hours as needed (for pain.).   Past Week at Unknown time  . levocetirizine (XYZAL) 5 MG tablet Take 5 mg by mouth daily as needed. For allergies.  0 Past Week at Unknown time  . phentermine 30 MG capsule Take 30 mg by mouth daily.   Past Month at Unknown time   No Known Allergies  Social History  Substance Use Topics  . Smoking status: Never Smoker  . Smokeless tobacco: Never Used  . Alcohol use No    History reviewed. No pertinent family history.   Review of Systems  Musculoskeletal: Positive for joint pain.  All other systems reviewed and are negative.   Objective:  Physical Exam  Constitutional: She is oriented to person, place, and time. She appears well-developed and well-nourished.  HENT:  Head: Normocephalic and atraumatic.  Eyes: Pupils are equal, round, and reactive to light. EOM are normal.  Neck: Normal range of motion. Neck supple.  Cardiovascular: Normal rate and regular rhythm.   Respiratory: Effort normal and breath sounds normal.  GI: Soft. Bowel sounds are normal.  Musculoskeletal:       Left hip: She exhibits decreased range of motion, decreased strength, tenderness and bony tenderness.  Neurological: She is alert and oriented to person, place, and time.  Skin: Skin is warm and dry.  Psychiatric: She has a normal  mood and affect.    Vital signs in last 24 hours: Temp:  [98.2 F (36.8 C)] 98.2 F (36.8 C) (08/10 0522) Pulse Rate:  [74] 74 (08/10 0522) Resp:  [16] 16 (08/10 0522) BP: (164)/(97) 164/97 (08/10 0522) SpO2:  [100 %] 100 % (08/10 0522)  Labs:   Estimated body mass index is 53.09 kg/m as calculated from the following:   Height as of 02/09/17: 5\' 1"  (1.549 m).   Weight as of 02/09/17: 281 lb (127.5 kg).   Imaging Review Plain radiographs demonstrate severe degenerative joint disease of the  left hip(s). The bone quality appears to be excellent for age and reported activity level.  Assessment/Plan:  End stage arthritis, left hip(s)  The patient history, physical examination, clinical judgement of the provider and imaging studies are consistent with end stage degenerative joint disease of the left hip(s) and total hip arthroplasty is deemed medically necessary. The treatment options including medical management, injection therapy, arthroscopy and arthroplasty were discussed at length. The risks and benefits of total hip arthroplasty were presented and reviewed. The risks due to aseptic loosening, infection, stiffness, dislocation/subluxation,  thromboembolic complications and other imponderables were discussed.  The patient acknowledged the explanation, agreed to proceed with the plan and consent was signed. Patient is being admitted for inpatient treatment for surgery, pain control, PT, OT, prophylactic antibiotics, VTE prophylaxis, progressive ambulation and ADL's and discharge planning.The patient is planning to be discharged home with home health services

## 2017-02-17 LAB — CBC
HEMATOCRIT: 28.7 % — AB (ref 36.0–46.0)
HEMOGLOBIN: 9.6 g/dL — AB (ref 12.0–15.0)
MCH: 26.8 pg (ref 26.0–34.0)
MCHC: 33.4 g/dL (ref 30.0–36.0)
MCV: 80.2 fL (ref 78.0–100.0)
Platelets: 175 10*3/uL (ref 150–400)
RBC: 3.58 MIL/uL — AB (ref 3.87–5.11)
RDW: 13.8 % (ref 11.5–15.5)
WBC: 8 10*3/uL (ref 4.0–10.5)

## 2017-02-17 LAB — BASIC METABOLIC PANEL
ANION GAP: 9 (ref 5–15)
BUN: 9 mg/dL (ref 6–20)
CHLORIDE: 105 mmol/L (ref 101–111)
CO2: 24 mmol/L (ref 22–32)
Calcium: 8.2 mg/dL — ABNORMAL LOW (ref 8.9–10.3)
Creatinine, Ser: 0.6 mg/dL (ref 0.44–1.00)
GFR calc non Af Amer: >60 mL/min (ref >60)
Glucose, Bld: 151 mg/dL — ABNORMAL HIGH (ref 65–99)
POTASSIUM: 3.5 mmol/L (ref 3.5–5.1)
Sodium: 138 mmol/L (ref 135–145)

## 2017-02-17 NOTE — Progress Notes (Signed)
Physical Therapy Treatment Patient Details Name: Victoria Kaufman MRN: [id number] DOB: [birth year] Today's Date: 02/17/2017    History of Present Illness Pt s/p L THR    PT Comments    Pt continues motivated and progressing well with mobility.   Follow Up Recommendations  Home health PT     Equipment Recommendations  Rolling walker with 5" wheels    Recommendations for Other Services OT consult     Precautions / Restrictions Precautions Precautions: Fall Restrictions Weight Bearing Restrictions: No LLE Weight Bearing: Weight bearing as tolerated    Mobility  Bed Mobility               General bed mobility comments: Pt OOB and requests back to chair  Transfers Overall transfer level: Needs assistance Equipment used: Rolling walker (2 wheeled) Transfers: Sit to/from Stand Sit to Stand: Min guard         General transfer comment: Pt required min guard and increased time due to pain.   Ambulation/Gait Ambulation/Gait assistance: Min guard Ambulation Distance (Feet): 150 Feet Assistive device: Rolling walker (2 wheeled) Gait Pattern/deviations: Step-to pattern;Decreased step length - right;Decreased step length - left;Shuffle;Trunk flexed Gait velocity: decr Gait velocity interpretation: Below normal speed for age/gender General Gait Details: Increased time with cues for posture and position from RW   Stairs            Wheelchair Mobility    Modified Rankin (Stroke Patients Only)       Balance                                            Cognition Arousal/Alertness: Awake/alert Behavior During Therapy: WFL for tasks assessed/performed Overall Cognitive Status: Within Functional Limits for tasks assessed                                        Exercises Total Joint Exercises Ankle Circles/Pumps: AROM;Both;20 reps;Supine Quad Sets: AROM;Both;10 reps;Supine Heel Slides: AAROM;Left;20 reps;Supine Hip  ABduction/ADduction: AAROM;Left;15 reps;Supine    General Comments        Pertinent Vitals/Pain Pain Assessment: 0-10 Pain Score: 5  Pain Location: Hip Pain Descriptors / Indicators: Aching;Sore Pain Intervention(s): Limited activity within patient's tolerance;Monitored during session;Premedicated before session;Ice applied    Home Living                      Prior Function            PT Goals (current goals can now be found in the care plan section) Acute Rehab PT Goals Patient Stated Goal: Go home  PT Goal Formulation: With patient Time For Goal Achievement: 02/20/17 Potential to Achieve Goals: Good Progress towards PT goals: Progressing toward goals    Frequency    7X/week      PT Plan Current plan remains appropriate    Co-evaluation              AM-PAC PT "6 Clicks" Daily Activity  Outcome Measure  Difficulty turning over in bed (including adjusting bedclothes, sheets and blankets)?: Total Difficulty moving from lying on back to sitting on the side of the bed? : Total Difficulty sitting down on and standing up from a chair with arms (e.g., wheelchair, bedside commode, etc,.)?: A Lot Help needed moving to and  from a bed to chair (including a wheelchair)?: A Little Help needed walking in hospital room?: A Little Help needed climbing 3-5 steps with a railing? : A Lot 6 Click Score: 12    End of Session Equipment Utilized During Treatment: Gait belt Activity Tolerance: Patient tolerated treatment well Patient left: in chair;with call bell/phone within reach;with family/visitor present Nurse Communication: Mobility status PT Visit Diagnosis: Difficulty in walking, not elsewhere classified (R26.2)     Time: 1337-1400 PT Time Calculation (min) (ACUTE ONLY): 23 min  Charges:  $Gait Training: 23-37 mins $Therapeutic Exercise: 8-22 mins                    G Codes:       Pg [phone]    Malakie Balis 02/17/2017, 3:46 PM

## 2017-02-17 NOTE — Progress Notes (Signed)
Subjective: 1 Day Post-Op Procedure(s) (LRB): LEFT TOTAL HIP ARTHROPLASTY ANTERIOR APPROACH (Left) Patient reports pain as moderate.  Tolerating diet well. Walked with PT to nurse's station .  Objective: Vital signs in last 24 hours: Temp:  [97.5 F (36.4 C)-99.3 F (37.4 C)] 98.3 F (36.8 C) (08/11 [id number]) Pulse Rate:  [65-97] 97 (08/11 0638) Resp:  [14-21] 16 (08/11 0638) BP: (128-175)/(56-97) 134/76 (08/11 0638) SpO2:  [98 %-100 %] 98 % (08/11 [id number]) Weight:  [291 lb 14.2 oz (132.4 kg)] 291 lb 14.2 oz (132.4 kg) (08/10 1220)  Intake/Output from previous day: 08/10 0701 - 08/11 0700 In: 5197.5 [P.O.:1080; I.V.:3802.5; IV Piggyback:315] Out: 5700 [Urine:4950; Blood:750] Intake/Output this shift: Total I/O In: 120 [P.O.:120] Out: -    Recent Labs  02/17/17 0528  HGB 9.6*    Recent Labs  02/17/17 0528  WBC 8.0  RBC 3.58*  HCT 28.7*  PLT 175    Recent Labs  02/17/17 0528  NA 138  K 3.5  CL 105  CO2 24  BUN 9  CREATININE 0.60  GLUCOSE 151*  CALCIUM 8.2*   No results for input(s): LABPT, INR in the last 72 hours.  Left lower Extremity:  Sensation intact distally Intact pulses distally Dorsiflexion/Plantar flexion intact Incision: dressing C/D/I Compartment soft  Assessment/Plan: 1 Day Post-Op Procedure(s) (LRB): LEFT TOTAL HIP ARTHROPLASTY ANTERIOR APPROACH (Left) Up with therapy  Plan discharge to home tomorrow  Victoria Kaufman Victoria Kaufman 02/17/2017, 9:12 AM

## 2017-02-17 NOTE — Therapy (Signed)
Occupational Therapy Evaluation Patient Details Name: Victoria Kaufman MRN: [id number] DOB: 06-Aug-1970 Today's Date: 02/17/2017    History of Present Illness Pt s/p L THR   Clinical Impression   Pt reports previously independent with functional mobility and ADLs. Currently, pt requires min guard for functional mobility and min assist for LB ADL. Pt planning to d/c home with family who is able to provide consistent supervision. OT will follow pt acutely to address established goals.     Follow Up Recommendations  No OT follow up    Equipment Recommendations  None   Recommendations for Other Services       Precautions / Restrictions Precautions Precautions: Fall Restrictions Weight Bearing Restrictions: No LLE Weight Bearing: Weight bearing as tolerated      Mobility Bed Mobility Overal bed mobility: Needs Assistance Bed Mobility: Supine to Sit     Supine to sit: HOB elevated;Modified independent (Device/Increase time)     General bed mobility comments: OOB with OT  Transfers Overall transfer level: Needs assistance Equipment used: Rolling walker (2 wheeled) Transfers: Sit to/from Stand Sit to Stand: Min guard         General transfer comment: Pt required min guard and increased time due to pain.     Balance  No apparent balance deficits. Not formally assessed.                                          ADL either performed or assessed with clinical judgement   ADL Overall ADL's : Needs assistance/impaired         Upper Body Bathing: Set up;Sitting Upper Body Bathing Details (indicate cue type and reason): Lower Body Bathing: Minimal assistance;Sit to/from stand Lower Body Bathing Details (indicate cue type and reason): Pt educated on initial need for help from family to bathe LB due to decreased ROM and increased pain.  Upper Body Dressing : Set up;Sitting   Lower Body Dressing: Minimal assistance;Sitting/lateral leans Lower Body  Dressing Details (indicate cue type and reason): Pt educated on initial need from help from family due to decrease ROM and increased pain. Pt education on use of sock aid to don socks.  Toilet Transfer: Min guard;Ambulation;RW       Tub/ Shower Transfer: Min guard;Ambulation;Shower seat;Rolling walker Pt educated on use of walker to transfer in and out of the shower.     Functional mobility during ADLs: Min guard;Rolling walker   General Comments: Pt with difficulty reaching feet due to decreased ROM and hip pain. May benefit from from AE to increase independence. OT began education on use of AE.        Vision         Perception     Praxis      Pertinent Vitals/Pain Pain Assessment: 0-10 Pain Score: 5  Pain Location: Hip Pain Descriptors / Indicators: Aching;Sore Pain Intervention(s): Limited activity within patient's tolerance;Monitored during session;Premedicated before session;Ice applied; applied ice.      Hand Dominance     Extremity/Trunk Assessment Upper Extremity Assessment Upper Extremity Assessment: Overall WFL for tasks assessed       Cervical / Trunk Assessment Cervical / Trunk Assessment: Normal   Communication Communication Communication: No difficulties   Cognition Arousal/Alertness: Awake/alert Behavior During Therapy: WFL for tasks assessed/performed Overall Cognitive Status: Within Functional Limits for tasks assessed  General Comments       Exercises Exercises: Total Joint Total Joint Exercises Ankle Circles/Pumps: AROM;Both;20 reps;Supine Quad Sets: AROM;Both;10 reps;Supine Heel Slides: AAROM;Left;20 reps;Supine Hip ABduction/ADduction: AAROM;Left;15 reps;Supine   Shoulder Instructions      Home Living Family/patient expects to be discharged to:: Private residence Living Arrangements: Spouse/significant other;Children Available Help at Discharge: Family Type of Home: House Home  Access: Level entry     Home Layout: Able to live on main level with bedroom/bathroom Alternate Level Stairs-Number of Steps: 1 (1 step to get in home) Alternate Level Stairs-Rails: Left Bathroom Shower/Tub: Database administratorWalk-in shower (Shower chair at home, access to tub shower bench)   Bathroom Toilet: Handicapped height Bathroom Accessibility: Yes How Accessible: Accessible via walker Home Equipment: Paediatric nursehower chair.           Prior Functioning/Environment Level of Independence: Independent with assistive device(s)                 OT Problem List: Decreased strength;Decreased activity tolerance;Pain      OT Treatment/Interventions: Self-care/ADL training;Energy conservation;DME and/or AE instruction;Therapeutic activities;Patient/family education    OT Goals(Current goals can be found in the care plan section) Acute Rehab OT Goals Patient Stated Goal: Go home  OT Goal Formulation: With patient/family Time For Goal Achievement: 03/03/17 Potential to Achieve Goals: Good ADL Goals Pt Will Perform Lower Body Bathing: with modified independence;with adaptive equipment;sit to/from stand Pt Will Perform Lower Body Dressing: with modified independence;with adaptive equipment;sit to/from stand Pt Will Perform Tub/Shower Transfer: Shower transfer;with supervision;with caregiver independent in assisting;ambulating;shower seat;rolling walker  OT Frequency: Min 2X/week   Barriers to D/C:            Co-evaluation              AM-PAC PT "6 Clicks" Daily Activity     Outcome Measure Help from another person eating meals?: A Little Help from another person taking care of personal grooming?: A Little Help from another person toileting, which includes using toliet, bedpan, or urinal?: A Little Help from another person bathing (including washing, rinsing, drying)?: A Little Help from another person to put on and taking off regular upper body clothing?: A Little Help from another person to  put on and taking off regular lower body clothing?: A Little 6 Click Score: 18   End of Session Equipment Utilized During Treatment: Gait belt;Rolling walker Nurse Communication: Mobility status  Activity Tolerance: Patient tolerated treatment well Patient left: in chair;with call bell/phone within reach  OT Visit Diagnosis: Unsteadiness on feet (R26.81);Pain Pain - Right/Left: Left Pain - part of body: Hip                Time: 1610-96040741-0800 OT Time Calculation (min): 19 min Charges:    G-Codes:     Cammy Copaourtney Shawanda Sievert, OTS [phone]  Cammy Copaourtney Rennee Coyne 02/17/2017, 1:53 PM

## 2017-02-17 NOTE — Progress Notes (Signed)
OT Note - Addendum    02/17/17 1400  OT Visit Information  Last OT Received On 02/17/17  OT Time Calculation  OT Start Time (ACUTE ONLY) 0741  OT Stop Time (ACUTE ONLY) 0800  OT Time Calculation (min) 19 min  OT General Charges  $OT Visit 1 Procedure  OT Evaluation  $OT Eval Low Complexity 1 Procedure   [facility] Aniket Paye, OT/L  [phone] 02/17/2017

## 2017-02-17 NOTE — Progress Notes (Signed)
Physical Therapy Treatment Patient Details Name: Victoria Kaufman MRN: [id number] DOB: 1971-02-06 Today's Date: 02/17/2017    History of Present Illness Pt s/p L THR    PT Comments    Pt progressing well with mobility   Follow Up Recommendations  Home health PT     Equipment Recommendations  Rolling walker with 5" wheels    Recommendations for Other Services OT consult     Precautions / Restrictions Precautions Precautions: Fall Restrictions Weight Bearing Restrictions: No LLE Weight Bearing: Weight bearing as tolerated    Mobility  Bed Mobility Overal bed mobility: Needs Assistance Bed Mobility: Supine to Sit     Supine to sit: HOB elevated;Modified independent (Device/Increase time)     General bed mobility comments: OOB with OT  Transfers Overall transfer level: Needs assistance Equipment used: Rolling walker (2 wheeled) Transfers: Sit to/from Stand Sit to Stand: Min guard         General transfer comment: Pt required min guard and increased time due to pain.   Ambulation/Gait Ambulation/Gait assistance: Min assist Ambulation Distance (Feet): 70 Feet Assistive device: Rolling walker (2 wheeled) Gait Pattern/deviations: Step-to pattern;Decreased step length - right;Decreased step length - left;Shuffle;Trunk flexed Gait velocity: decr Gait velocity interpretation: Below normal speed for age/gender General Gait Details: cues for sequence, posture and position from [employer]            Wheelchair Mobility    Modified Rankin (Stroke Patients Only)       Balance                                            Cognition Arousal/Alertness: Awake/alert Behavior During Therapy: WFL for tasks assessed/performed Overall Cognitive Status: Within Functional Limits for tasks assessed                                        Exercises Total Joint Exercises Ankle Circles/Pumps: AROM;Both;20 reps;Supine Quad Sets:  AROM;Both;10 reps;Supine Heel Slides: AAROM;Left;20 reps;Supine Hip ABduction/ADduction: AAROM;Left;15 reps;Supine    General Comments        Pertinent Vitals/Pain Pain Assessment: 0-10 Pain Score: 5  Pain Location: Hip Pain Descriptors / Indicators: Aching;Sore Pain Intervention(s): Limited activity within patient's tolerance;Monitored during session;Premedicated before session;Ice applied    Home Living Family/patient expects to be discharged to:: Private residence Living Arrangements: Spouse/significant other;Children Available Help at Discharge: Family Type of Home: House Home Access: Level entry   Home Layout: Able to live on main level with bedroom/bathroom Home Equipment: None      Prior Function Level of Independence: Independent with assistive device(s)          PT Goals (current goals can now be found in the care plan section) Acute Rehab PT Goals Patient Stated Goal: Go home  PT Goal Formulation: With patient Time For Goal Achievement: 02/20/17 Potential to Achieve Goals: Good Progress towards PT goals: Progressing toward goals    Frequency    7X/week      PT Plan Current plan remains appropriate    Co-evaluation              AM-PAC PT "6 Clicks" Daily Activity  Outcome Measure  Difficulty turning over in bed (including adjusting bedclothes, sheets and blankets)?: Total Difficulty moving from lying on back to sitting  on the side of the bed? : Total Difficulty sitting down on and standing up from a chair with arms (e.g., wheelchair, bedside commode, etc,.)?: Total Help needed moving to and from a bed to chair (including a wheelchair)?: A Little Help needed walking in hospital room?: A Little Help needed climbing 3-5 steps with a railing? : A Lot 6 Click Score: 11    End of Session Equipment Utilized During Treatment: Gait belt Activity Tolerance: Patient tolerated treatment well Patient left: in chair;with call bell/phone within  reach Nurse Communication: Mobility status PT Visit Diagnosis: Difficulty in walking, not elsewhere classified (R26.2)     Time: 8295-62130933-1003 PT Time Calculation (min) (ACUTE ONLY): 30 min  Charges:  $Gait Training: 8-22 mins $Therapeutic Exercise: 8-22 mins                    G Codes:       Pg [phone]    Victoria Kaufman 02/17/2017, 12:38 PM

## 2017-02-18 MED ORDER — METHOCARBAMOL 500 MG PO TABS: 500.0000 mg | ORAL_TABLET | Freq: Four times a day (QID) | ORAL | 0 refills | Status: DC | PRN

## 2017-02-18 MED ORDER — ASPIRIN 81 MG PO CHEW: 81.0000 mg | CHEWABLE_TABLET | Freq: Two times a day (BID) | ORAL | 0 refills | Status: AC

## 2017-02-18 MED ORDER — OXYCODONE-ACETAMINOPHEN 5-325 MG PO TABS: 1.0000 | ORAL_TABLET | ORAL | 0 refills | Status: DC | PRN

## 2017-02-18 NOTE — Progress Notes (Signed)
Subjective: 2 Days Post-Op Procedure(s) (LRB): LEFT TOTAL HIP ARTHROPLASTY ANTERIOR APPROACH (Left) Patient reports pain as moderate.  Working well with therapy.  Objective: Vital signs in last 24 hours: Temp:  [98.2 F (36.8 C)-98.7 F (37.1 C)] 98.2 F (36.8 C) (08/12 0455) Pulse Rate:  [83-95] 88 (08/12 0455) Resp:  [15-16] 16 (08/12 0455) BP: (134-169)/(67-91) 154/76 (08/12 0455) SpO2:  [96 %-100 %] 100 % (08/12 0455)  Intake/Output from previous day: 08/11 0701 - 08/12 0700 In: 1320 [P.O.:1320] Out: 3550 [Urine:3550] Intake/Output this shift: No intake/output data recorded.   Recent Labs  02/17/17 0528  HGB 9.6*    Recent Labs  02/17/17 0528  WBC 8.0  RBC 3.58*  HCT 28.7*  PLT 175    Recent Labs  02/17/17 0528  NA 138  K 3.5  CL 105  CO2 24  BUN 9  CREATININE 0.60  GLUCOSE 151*  CALCIUM 8.2*   No results for input(s): LABPT, INR in the last 72 hours.  Sensation intact distally Intact pulses distally Dorsiflexion/Plantar flexion intact Incision: dressing C/D/I  Assessment/Plan: 2 Days Post-Op Procedure(s) (LRB): LEFT TOTAL HIP ARTHROPLASTY ANTERIOR APPROACH (Left) Up with therapy Plan for discharge tomorrow Discharge home with home health  [name] 02/18/2017, 9:44 AM

## 2017-02-18 NOTE — Progress Notes (Signed)
Occupational Therapy Treatment Patient Details Name: Victoria AddisonLinda Cremer MRN: [id number] DOB: 1971/01/09 Today's Date: 02/18/2017    History of present illness Pt s/p L THR   OT comments  Pt doing very well this am and tolerated all OT tasks well. Education provided on LB self care and shower transfers as well as functional transfers. Family can assist with LB self care at d/c and she is not currently interested in obtaining AE for LB tasks.    Follow Up Recommendations  No OT follow up    Equipment Recommendations  3 in 1 bedside commode (wide)    Recommendations for Other Services      Precautions / Restrictions Precautions Precautions: Fall Restrictions Weight Bearing Restrictions: No LLE Weight Bearing: Weight bearing as tolerated       Mobility Bed Mobility Overal bed mobility: Modified Independent       Supine to sit: HOB elevated;Modified independent (Device/Increase time)     General bed mobility comments: OOB with OT  Transfers Overall transfer level: Needs assistance Equipment used: Rolling walker (2 wheeled) Transfers: Sit to/from Stand Sit to Stand: Supervision         General transfer comment: min cues for L LE placement.    Balance                                           ADL either performed or assessed with clinical judgement   ADL                                   Tub/ Shower Transfer: Min guard;Walk-in shower;Rolling walker;3 in 1     General ADL Comments: Pt states her family can assist her with LB self care and she is aware of AE options but states they will help. Discussed importance of trying to reach down to her feet and don sock, etc to increase L hip flexibility and then if she cant perform task, allow family to assist. Discussed sequence for LB dressing and sitting down to don clothing over LEs for safety. Inforced to have walker in front of her whenever she stands including to pull up clothing. Pt  practiced stepping over shower ledge and did very well. Reviewed sequence for stepping in/out as well as placement of shower chair for safety. Pt has a HH shower.      Vision Patient Visual Report: No change from baseline     Perception     Praxis      Cognition Arousal/Alertness: Awake/alert Behavior During Therapy: WFL for tasks assessed/performed Overall Cognitive Status: Within Functional Limits for tasks assessed                                          Exercises     Shoulder Instructions       General Comments      Pertinent Vitals/ Pain       Pain Assessment: 0-10 Pain Score: 5  Pain Location: left Hip Pain Descriptors / Indicators: Aching;Sore Pain Intervention(s): Limited activity within patient's tolerance;Monitored during session;Premedicated before session;Ice applied  Home Living  Prior Functioning/Environment              Frequency  Min 2X/week        Progress Toward Goals  OT Goals(current goals can now be found in the care plan section)  Progress towards OT goals: Progressing toward goals  Acute Rehab OT Goals Patient Stated Goal: Go home   Plan      Co-evaluation                 AM-PAC PT "6 Clicks" Daily Activity     Outcome Measure   Help from another person eating meals?: None Help from another person taking care of personal grooming?: A Little Help from another person toileting, which includes using toliet, bedpan, or urinal?: A Little Help from another person bathing (including washing, rinsing, drying)?: A Little Help from another person to put on and taking off regular upper body clothing?: A Little   6 Click Score: 16    End of Session Equipment Utilized During Treatment: Rolling walker  OT Visit Diagnosis: Unsteadiness on feet (R26.81)   Activity Tolerance Patient tolerated treatment well   Patient Left in chair;with call bell/phone  within reach   Nurse Communication          Time: [phone] OT Time Calculation (min): 27 min  Charges: OT General Charges $OT Visit: 1 Procedure OT Treatments $Self Care/Home Management : 8-22 mins $Therapeutic Activity: 8-22 mins     Zannie Kehr Heru Montz 02/18/2017, 10:51 AM

## 2017-02-18 NOTE — Progress Notes (Signed)
   02/18/17 1500  PT Visit Information  Last PT Received On 02/18/17--pt sleepy, wanted to stay in bed but agreeable to exercise  Assistance Needed +1  History of Present Illness Pt s/p L THR  Subjective Data  Subjective sleepy  Patient Stated Goal Go home   Precautions  Precautions Fall  Restrictions  Weight Bearing Restrictions No  LLE Weight Bearing WBAT  Pain Assessment  Pain Assessment 0-10  Pain Score 4  Pain Location left Hip  Pain Descriptors / Indicators Aching;Sore  Pain Intervention(s) Limited activity within patient's tolerance;Monitored during session  Cognition  Arousal/Alertness Awake/alert  Behavior During Therapy WFL for tasks assessed/performed  Overall Cognitive Status Within Functional Limits for tasks assessed  Bed Mobility  Overal bed mobility Modified Independent  Total Joint Exercises  Ankle Circles/Pumps AROM;Both;20 reps;Supine  Quad Sets AROM;Both;10 reps;Supine  Gluteal Sets Strengthening;Both;10 reps  Heel Slides AAROM;Left;20 reps;Supine  Hip ABduction/ADduction AAROM;20 reps;Supine;Left  PT - End of Session  Activity Tolerance Patient tolerated treatment well  Patient left in bed;with call bell/phone within reach;with family/visitor present  Nurse Communication Mobility status  PT - Assessment/Plan  PT Plan Current plan remains appropriate  PT Visit Diagnosis Difficulty in walking, not elsewhere classified (R26.2)  PT Frequency (ACUTE ONLY) 7X/week  Follow Up Recommendations Home health PT  PT equipment Rolling walker with 5" wheels  AM-PAC PT "6 Clicks" Daily Activity Outcome Measure  Difficulty turning over in bed (including adjusting bedclothes, sheets and blankets)? 3  Difficulty moving from lying on back to sitting on the side of the bed?  3  Difficulty sitting down on and standing up from a chair with arms (e.g., wheelchair, bedside commode, etc,.)? 3  Help needed moving to and from a bed to chair (including a wheelchair)? 3  Help  needed walking in hospital room? 3  Help needed climbing 3-5 steps with a railing?  3  6 Click Score 18  Mobility G Code  CK  PT Goal Progression  Progress towards PT goals Progressing toward goals  Acute Rehab PT Goals  PT Goal Formulation With patient  Time For Goal Achievement 02/20/17  Potential to Achieve Goals Good  PT Time Calculation  PT Start Time (ACUTE ONLY) 1440  PT Stop Time (ACUTE ONLY) 1453  PT Time Calculation (min) (ACUTE ONLY) 13 min  PT Treatments  $Therapeutic Exercise 8-22 mins

## 2017-02-18 NOTE — Care Management Note (Signed)
Case Management Note  Patient Details  Name: Victoria AddisonLinda Neely MRN: [id number] Date of Birth: May 18, 1971  Subjective/Objective:     S/p L THR               Action/Plan: Discharge Planning:  NCM spoke to pt and son/daughter at bedside. Pt lives at home with husband and teenage children who can assist her at home. Offered choice for HH/list provided. Pt agreeable to [facility]. Contacted [facility] rep for RW and 3n1 bedside commode for home.   PCP Deatra JamesSUN, VYVYAN MD   Expected Discharge Date:  02/18/2017             Expected Discharge Plan:  Home w Home Health Services  In-House Referral:  NA  Discharge planning Services  CM Consult  Post Acute Care Choice:  Home Health Choice offered to:  Patient  DME Arranged:  3-N-1, Walker rolling DME Agency:  [facility]  HH Arranged:  PT HH Agency:  [facility]  Status of Service:  Completed, signed off  If discussed at [employer] Length of Stay Meetings, dates discussed:    Additional Comments:  Elliot CousinShavis, Alyna Stensland Ellen, RN 02/18/2017, 9:43 AM

## 2017-02-18 NOTE — Discharge Instructions (Signed)

## 2017-02-18 NOTE — Progress Notes (Signed)
Physical Therapy Treatment Patient Details Name: Victoria Kaufman MRN: [id number] DOB: 1970-09-15 Today's Date: 02/18/2017    History of Present Illness Pt s/p L THR    PT Comments    Pt progressing well; practiced shower transfer with OT and we discussed using this same technique for going up the step to get into her bed, pt verbalizes understanding; ex's deferred at this time as pt wanted to take a nap--will see again later today   Follow Up Recommendations  Home health PT     Equipment Recommendations  Rolling walker with 5" wheels    Recommendations for Other Services       Precautions / Restrictions Precautions Precautions: Fall Restrictions Weight Bearing Restrictions: No LLE Weight Bearing: Weight bearing as tolerated    Mobility  Bed Mobility               General bed mobility comments: OOB with OT  Transfers Overall transfer level: Needs assistance Equipment used: Rolling walker (2 wheeled) Transfers: Sit to/from Stand Sit to Stand: Supervision         General transfer comment: subtle cues for hand placement and LLE position  Ambulation/Gait Ambulation/Gait assistance: Min guard Ambulation Distance (Feet): 120 Feet Assistive device: Rolling walker (2 wheeled) Gait Pattern/deviations: Step-to pattern;Decreased step length - right;Decreased step length - left;Shuffle;Trunk flexed Gait velocity: decr Gait velocity interpretation: Below normal speed for age/gender General Gait Details: Increased time with cues for posture and position from [employer]            Wheelchair Mobility    Modified Rankin (Stroke Patients Only)       Balance                                            Cognition Arousal/Alertness: Awake/alert Behavior During Therapy: WFL for tasks assessed/performed Overall Cognitive Status: Within Functional Limits for tasks assessed                                        Exercises       General Comments        Pertinent Vitals/Pain Pain Assessment: 0-10 Pain Score: 5  Pain Location: left Hip Pain Descriptors / Indicators: Aching;Sore Pain Intervention(s): Limited activity within patient's tolerance;Monitored during session;Premedicated before session;Ice applied    Home Living                      Prior Function            PT Goals (current goals can now be found in the care plan section) Acute Rehab PT Goals Patient Stated Goal: Go home  PT Goal Formulation: With patient Time For Goal Achievement: 02/20/17 Potential to Achieve Goals: Good Progress towards PT goals: Progressing toward goals    Frequency    7X/week      PT Plan Current plan remains appropriate    Co-evaluation              AM-PAC PT "6 Clicks" Daily Activity  Outcome Measure  Difficulty turning over in bed (including adjusting bedclothes, sheets and blankets)?: Total Difficulty moving from lying on back to sitting on the side of the bed? : Total Difficulty sitting down on and standing up from a chair with arms (  e.g., wheelchair, bedside commode, etc,.)?: A Little Help needed moving to and from a bed to chair (including a wheelchair)?: A Little Help needed walking in hospital room?: A Little Help needed climbing 3-5 steps with a railing? : A Little 6 Click Score: 14    End of Session   Activity Tolerance: Patient tolerated treatment well Patient left: in chair;with call bell/phone within reach   PT Visit Diagnosis: Difficulty in walking, not elsewhere classified (R26.2)     Time: [id number] PT Time Calculation (min) (ACUTE ONLY): 20 min  Charges:  $Gait Training: 8-22 mins                    G CodesDrucilla Chalet:       Victoria Kaufman, PT Pager: [phone] 02/18/2017    Drucilla ChaletWILLIAMS,Victoria Pillard 02/18/2017, 10:17 AM

## 2017-02-19 NOTE — Progress Notes (Signed)
Patient ID: Victoria AddisonLinda Kaufman, female   DOB: 1971-05-17, 46 y.o.   MRN: [id number] No acute changes.  Doing well overall.  Can be discharged to home today.

## 2017-02-19 NOTE — Progress Notes (Signed)
Physical Therapy Treatment Patient Details Name: Victoria AddisonLinda Kaufman MRN: [id number] DOB: 06-01-1971 Today's Date: 02/19/2017    History of Present Illness Pt s/p L THR    PT Comments    Continues to make excellent progress   Follow Up Recommendations  Home health PT     Equipment Recommendations  Rolling walker with 5" wheels    Recommendations for Other Services       Precautions / Restrictions Restrictions Weight Bearing Restrictions: No LLE Weight Bearing: Weight bearing as tolerated    Mobility  Bed Mobility               General bed mobility comments: in chair  Transfers   Equipment used: Rolling walker (2 wheeled) Transfers: Sit to/from Stand Sit to Stand: Modified independent (Device/Increase time)            Ambulation/Gait Ambulation/Gait assistance: Supervision;Modified independent (Device/Increase time) Ambulation Distance (Feet): 140 Feet Assistive device: Rolling walker (2 wheeled) Gait Pattern/deviations: Step-to pattern;Decreased step length - right;Decreased step length - left Gait velocity: decr   General Gait Details: incr time, cues for gait progression, attempting step through   [employer]            Wheelchair Mobility    Modified Rankin (Stroke Patients Only)       Balance                                            Cognition Arousal/Alertness: Awake/alert Behavior During Therapy: WFL for tasks assessed/performed Overall Cognitive Status: Within Functional Limits for tasks assessed                                        Exercises Total Joint Exercises Hip ABduction/ADduction: AROM;Strengthening;Left;15 reps;Standing Knee Flexion: AROM;Strengthening;Left;15 reps;Standing Marching in Standing: AROM;Strengthening;10 reps;Standing    General Comments        Pertinent Vitals/Pain Pain Assessment: 0-10 Pain Score: 4  Pain Location: left Hip Pain Descriptors / Indicators:  Aching;Sore Pain Intervention(s): Limited activity within patient's tolerance;Monitored during session;Premedicated before session;Ice applied    Home Living                      Prior Function            PT Goals (current goals can now be found in the care plan section) Acute Rehab PT Goals Patient Stated Goal: Go home  PT Goal Formulation: With patient Time For Goal Achievement: 02/20/17 Potential to Achieve Goals: Good Progress towards PT goals: Progressing toward goals    Frequency    7X/week      PT Plan Current plan remains appropriate    Co-evaluation              AM-PAC PT "6 Clicks" Daily Activity  Outcome Measure  Difficulty turning over in bed (including adjusting bedclothes, sheets and blankets)?: A Little Difficulty moving from lying on back to sitting on the side of the bed? : None Difficulty sitting down on and standing up from a chair with arms (e.g., wheelchair, bedside commode, etc,.)?: None Help needed moving to and from a bed to chair (including a wheelchair)?: None Help needed walking in hospital room?: None Help needed climbing 3-5 steps with a railing? : A Little 6 Click Score: 22  End of Session   Activity Tolerance: Patient tolerated treatment well Patient left: in chair;with call bell/phone within reach;with family/visitor present;with nursing/sitter in room Nurse Communication: Other (comment) (ready for D/C) PT Visit Diagnosis: Difficulty in walking, not elsewhere classified (R26.2)     Time: 9604-5409 PT Time Calculation (min) (ACUTE ONLY): 25 min  Charges:  $Gait Training: 8-22 mins $Therapeutic Exercise: 8-22 mins                    G CodesDrucilla Chalet, PT Pager: [phone] 02/19/2017    Drucilla Chalet 02/19/2017, 11:22 AM

## 2017-02-19 NOTE — Discharge Summary (Signed)
Patient ID: Victoria Kaufman MRN: [id number] DOB/AGE: Nov 16, 1970 46 y.o.  Admit date: 02/16/2017 Discharge date: 02/19/2017  Admission Diagnoses:  Principal Problem:   Unilateral primary osteoarthritis, left hip Active Problems:   Status post total replacement of left hip   Discharge Diagnoses:  Same  Past Medical History:  Diagnosis Date  . Arthritis   . Headache    hx of migraines 1 or 2 in life  . History of pneumonia   . Pneumonia     Surgeries: Procedure(s): LEFT TOTAL HIP ARTHROPLASTY ANTERIOR APPROACH on 02/16/2017   Consultants:   Discharged Condition: Improved  Hospital Course: Victoria Kaufman is an 46 y.o. female who was admitted 02/16/2017 for operative treatment ofUnilateral primary osteoarthritis, left hip. Patient has severe unremitting pain that affects sleep, daily activities, and work/hobbies. After pre-op clearance the patient was taken to the operating room on 02/16/2017 and underwent  Procedure(s): LEFT TOTAL HIP ARTHROPLASTY ANTERIOR APPROACH.    Patient was given perioperative antibiotics: Anti-infectives    Start     Dose/Rate Route Frequency Ordered Stop   02/16/17 1400  ceFAZolin (ANCEF) IVPB 2g/100 mL premix     2 g 200 mL/hr over 30 Minutes Intravenous Every 6 hours 02/16/17 1104 02/16/17 2027   02/16/17 0600  ceFAZolin (ANCEF) 3 g in dextrose 5 % 50 mL IVPB     3 g 130 mL/hr over 30 Minutes Intravenous On call to O.R. 02/15/17 1228 02/16/17 0749       Patient was given sequential compression devices, early ambulation, and chemoprophylaxis to prevent DVT.  Patient benefited maximally from hospital stay and there were no complications.    Recent vital signs: Patient Vitals for the past 24 hrs:  BP Temp Temp src Pulse Resp SpO2  02/19/17 0613 (!) 150/78 99 F (37.2 C) Oral 100 18 100 %  02/18/17 2125 (!) 143/69 99.2 F (37.3 C) Oral (!) 105 18 98 %  02/18/17 1415 139/61 99.1 F (37.3 C) - 93 18 100 %     Recent laboratory studies:   Recent Labs  02/17/17 0528  WBC 8.0  HGB 9.6*  HCT 28.7*  PLT 175  NA 138  K 3.5  CL 105  CO2 24  BUN 9  CREATININE 0.60  GLUCOSE 151*  CALCIUM 8.2*     Discharge Medications:   Allergies as of 02/19/2017   No Known Allergies     Medication List    STOP taking these medications   diclofenac sodium 1 % Gel Commonly known as:  VOLTAREN   ibuprofen 200 MG tablet Commonly known as:  ADVIL,MOTRIN     TAKE these medications   aspirin 81 MG chewable tablet Chew 1 tablet (81 mg total) by mouth 2 (two) times daily.   COLLAGEN PO Take 1 capsule by mouth daily.   DUEXIS 800-26.6 MG Tabs Generic drug:  Ibuprofen-Famotidine Take 1 tablet by mouth every 8 (eight) hours.   levocetirizine 5 MG tablet Commonly known as:  XYZAL Take 5 mg by mouth daily as needed. For allergies.   methocarbamol 500 MG tablet Commonly known as:  ROBAXIN Take 1 tablet (500 mg total) by mouth every 6 (six) hours as needed for muscle spasms.   oxyCODONE-acetaminophen 5-325 MG tablet Commonly known as:  ROXICET Take 1-2 tablets by mouth every 4 (four) hours as needed.   phentermine 30 MG capsule Take 30 mg by mouth daily.            Durable Medical Equipment  Start     Ordered   02/16/17 1105  DME Walker rolling  Once    Question:  Patient needs a walker to treat with the following condition  Answer:  Status post total replacement of left hip   02/16/17 1104   02/16/17 1105  DME 3 n 1  Once     02/16/17 1104      Diagnostic Studies: Dg Pelvis Portable  Result Date: 02/16/2017 CLINICAL DATA:  Left total hip replacement. EXAM: PORTABLE PELVIS 1-2 VIEWS COMPARISON:  Intraoperative x-rays from same day; prior pelvic x-rays from January 15, 2017. FINDINGS: Interval left total hip arthroplasty without evidence of hardware failure or loosening. Components are well aligned. Mild degenerative changes of the pubic symphysis are similar to prior study. The right hip joint space is  preserved. IMPRESSION: Left total hip arthroplasty without evidence of acute postoperative complication. These results will be called to the ordering clinician or representative by the Radiology Department at the imaging location. Electronically Signed   By: Obie DredgeWilliam T Derry M.D.   On: 02/16/2017 10:18   Dg C-arm 1-60 Min-no Report  Result Date: 02/16/2017 Fluoroscopy was utilized by the requesting physician.  No radiographic interpretation.   Dg Hip Operative Unilat W Or W/o Pelvis Left  Result Date: 02/16/2017 CLINICAL DATA:  Status post total hip replacement EXAM: OPERATIVE LEFT HIP  1 VIEW TECHNIQUE: Fluoroscopic spot image(s) were submitted for interpretation post-operatively. FLUOROSCOPY TIME:  0 minutes 30 seconds; 2 acquired images COMPARISON:  None. FINDINGS: There is a total hip replacement on the left with prosthetic components appearing well-seated on frontal view. No evident fracture or dislocation. There is mild narrowing of the right hip joint on frontal view. IMPRESSION: Total hip replaced on the left with prosthetic components well-seated. No acute fracture or dislocation evident. Electronically Signed   By: Bretta BangWilliam  Woodruff III M.D.   On: 02/16/2017 09:43    Disposition: 01-Home or Self Care  Discharge Instructions    Discharge patient    Complete by:  As directed    Discharge disposition:  01-Home or Self Care   Discharge patient date:  02/19/2017      Follow-up Information    [facility] Follow up.   Specialty:  Home Health Services Why:  Home Health Physical Therapy- agency will contact you to arrange initial visit Contact information: [address] [place] [address] [place] [address] [phone]        Kathryne HitchBlackman, Sabryna Lahm Y, MD Follow up in 2 week(s).   Specialty:  Orthopedic Surgery Contact information: [address] [place] [address] [phone]            Signed: Kathryne HitchChristopher Y Ashrita Chrismer 02/19/2017, 7:03 AM

## 2017-02-19 NOTE — Op Note (Signed)
NAME:  Victoria Kaufman, Victoria Kaufman                   ACCOUNT NO.:  MEDICAL RECORD NO.:  [id number]  LOCATION:                                 FACILITY:  PHYSICIAN:  Victoria Kaufman, M.D.DATE OF BIRTH:  DATE OF PROCEDURE:  02/16/2017 DATE OF DISCHARGE:                              OPERATIVE REPORT   PREOPERATIVE DIAGNOSIS:  Primary osteoarthritis and degenerative joint disease, left hip.  POSTOPERATIVE DIAGNOSIS:  Primary osteoarthritis and degenerative joint disease, left hip.  PROCEDURE:  Left total hip arthroplasty through direct anterior approach.  IMPLANTS:  DePuy Sector Gription acetabular component size 50, size 32 +4 polyethylene liner, size 10 Corail femoral component with varus offset, size 32 +1 ceramic hip ball.  SURGEON:  Victoria Kaufman, M.D.  ASSISTANT:  Victoria CanalGilbert Clark, PA-C.  ANESTHESIA:  General.  ANTIBIOTICS:  3 g of IV Ancef.  BLOOD LOSS:  600-700 mL.  COMPLICATIONS:  None.  INDICATIONS:  Ms. [name] is a very pleasant 46 year old female, who is morbidly obese with severe degenerative arthritis of her left hip.  This was confirmed with x-rays as well as physical exam.  She has tried and failed all forms of conservative treatment.  She is not a diabetic and not a smoker, and has had difficulty with weight loss.  This disease has detrimentally affected her activities of daily living, her quality of life, her mobility.  At this point, we have recommended total hip arthroplasty through a direct anterior approach.  We explained in detail the risk of acute blood loss anemia, nerve and vessel injury, fracture, infection, dislocation, DVT.  Risks are heightened for soft tissue issues given her morbid obesity as well as all complicating factors being heightened due to the morbid obesity.  She understands our goals are to decrease pain, improve mobility and overall improved quality of life.  PROCEDURE DESCRIPTION:  After informed consent was obtained,  appropriate left hip was marked.  She was brought to the operating room where general anesthesia was obtained while she was on her stretcher.  A Foley catheter was placed, and both feet had traction boots applied to them. Next, she was placed supine on the Hana fracture table with a perineal post in place and both legs in inline skeletal traction devices, but no traction applied.  Her left operative hip was prepped and draped with DuraPrep and sterile drapes.  A time-out was called, she was identified as correct patient and correct left hip.  We then made an incision just inferior and posterior to the anterior-superior iliac spine and carried this obliquely down the leg.  We dissected down the tensor fascia lata muscle.  Tensor fascia was then divided longitudinally to proceed with a direct anterior approach to the hip.  We identified and cauterized the circumflex vessels, then identified the hip capsule.  I opened up the hip capsule in an L-type format finding a very large joint effusion. There was significant periarticular osteophytes as well.  We placed Cobra retractors around the medial and lateral femoral neck and then made our femoral neck cut with an oscillating saw proximal to the lesser trochanter and completed this with an osteotome.  We placed a corkscrew  guide in the femoral head and removed the femoral head in its entirety and found it to be completely devoid of cartilage.  We then cleaned the acetabulum and remnants of the acetabular labrum and other debris.  I placed a bent Hohmann over the medial acetabular rim and then began reaming from a size 42-43 reamer going up to a size 50 with all reamers under direct visualization and the last reamer under direct fluoroscopy, so we could obtain our depth of reaming, our inclination and anteversion.  Once we were pleased with this, we placed the real DePuy Sector Gription acetabular component size 50, and a 32 +4  neutral polyethylene liner given her medialization of the cup.  We then turned attention to the femur with the leg externally rotated to 120 degrees, extended and adducted, we were able to place a Mueller retractor medially and a Hohmann retractor behind the greater trochanter.  We released the lateral joint capsule, used a box cutting osteotome to enter the femoral canal and a rongeur to lateralize.  We then began broaching from a size 8 broach using the Corail broaching system up to a size 10.  With the size 10 in place, we trialed a varus offset femoral neck based on her anatomy and 32 +1 hip ball.  We brought the leg back over and up with traction and internal rotation reducing the pelvis.  We were pleased with leg length, offset and stability.  We then dislocated the hip and removed the trial components.  We were able to place the real Corail femoral component with varus offset size 10 and the real 32 +1 ceramic hip ball and again reduced this in the acetabulum.  We then irrigated the soft tissue with normal saline solution using pulsatile lavage.  We closed the joint capsule with interrupted #1 Ethibond suture followed by running #1 Vicryl in the tensor fascia, 0 Vicryl in the deep tissue, 2-0 Vicryl in the subcutaneous tissue, interrupted staples on the skin.  Xeroform and Aquacel dressings applied.  She was taken off the Hana table, awakened, extubated, and taken to the recovery room in stable condition.  All final counts were correct.  There were no complications noted.  Of note, Victoria Canal, PA-C, assisted in the entire case.  His assistance was crucial for facilitating all aspects of this case.     Victoria Panda. Magnus Ivan, M.D.     CYB/MEDQ  D:  02/16/2017  T:  02/16/2017  Job:  [id number]

## 2017-02-20 ENCOUNTER — Telehealth (INDEPENDENT_AMBULATORY_CARE_PROVIDER_SITE_OTHER): Payer: Self-pay | Admitting: Orthopaedic Surgery

## 2017-02-20 NOTE — Telephone Encounter (Signed)
Verbal order given  

## 2017-02-20 NOTE — Telephone Encounter (Signed)
Gretchen (PT) with [facility] called needing [facility] 3  The number to contact Vinnie LangtonGretchen is [phone]

## 2017-03-01 ENCOUNTER — Ambulatory Visit (INDEPENDENT_AMBULATORY_CARE_PROVIDER_SITE_OTHER): Payer: BLUE CROSS/BLUE SHIELD | Admitting: Orthopaedic Surgery

## 2017-03-01 DIAGNOSIS — Z96642 Presence of left artificial hip joint: Secondary | ICD-10-CM

## 2017-03-01 MED ORDER — OXYCODONE-ACETAMINOPHEN 5-325 MG PO TABS: 1.0000 | ORAL_TABLET | ORAL | 0 refills | Status: DC | PRN

## 2017-03-01 NOTE — Progress Notes (Signed)
The patient is 2 weeks tomorrow status post a left anterior hip replacement. She is someone is morbidly obese and is on aspirin twice daily and doing well.  On examination incision looks really good. She does have a seroma that I drained about 60 mL of fluid off of I removed all staples and placed Steri-Strips. Her leg lengths are equal. She family with a cane and says she is doing well.  We will likely keep her out of work for 3 months to the activities that she does. I would like to see her back in just 2 weeks for a recheck of her incision. She'll continue increase her activities and try to keep her groin crease as dry as possible on the left side. I talked her about this in length. She can stop her twice daily aspirin but she'll still work on mobilization pump in her feet. I did refill her oxycodone.

## 2017-03-19 ENCOUNTER — Ambulatory Visit (INDEPENDENT_AMBULATORY_CARE_PROVIDER_SITE_OTHER): Payer: BLUE CROSS/BLUE SHIELD | Admitting: Physician Assistant

## 2017-03-19 ENCOUNTER — Encounter (INDEPENDENT_AMBULATORY_CARE_PROVIDER_SITE_OTHER): Payer: Self-pay | Admitting: Physician Assistant

## 2017-03-19 DIAGNOSIS — Z96642 Presence of left artificial hip joint: Secondary | ICD-10-CM

## 2017-03-19 MED ORDER — OXYCODONE-ACETAMINOPHEN 5-325 MG PO TABS: 1.0000 | ORAL_TABLET | ORAL | 0 refills | Status: AC | PRN

## 2017-03-19 MED ORDER — METHOCARBAMOL 500 MG PO TABS: 500.0000 mg | ORAL_TABLET | Freq: Four times a day (QID) | ORAL | 0 refills | Status: AC | PRN

## 2017-03-19 MED ORDER — DOXYCYCLINE HYCLATE 100 MG PO TABS: 100.0000 mg | ORAL_TABLET | Freq: Two times a day (BID) | ORAL | 0 refills | Status: AC

## 2017-03-19 NOTE — Progress Notes (Signed)
Office Visit Note   Patient: Victoria Kaufman           Date of Birth: 01-09-1971           MRN: [id number] Visit Date: 03/19/2017              Requested by: Deatra James, MD [phone] [place], [place] [address] PCP: Deatra James, MD   Assessment & Plan: Visit Diagnoses:  1. Status post total replacement of left hip     Plan: She'll wash the wound with an antibacterial soap daily. Samples of Bactroban's sake given she'll start applying a thin layer after drying the wound completely. Also placed her on doxycycline 100 mg twice a day for 14 days. Refill on her Robaxin and oxycodone were given today. Questions encouraged and answered. We'll see her back in 1 weeks sooner if there is any questions or concerns.  Follow-Up Instructions: Return in about 1 week (around 03/26/2017) for wound check.   Orders:  No orders of the defined types were placed in this encounter.  Meds ordered this encounter  Medications  . methocarbamol (ROBAXIN) 500 MG tablet    Sig: Take 1 tablet (500 mg total) by mouth every 6 (six) hours as needed for muscle spasms.    Dispense:  60 tablet    Refill:  0  . oxyCODONE-acetaminophen (ROXICET) 5-325 MG tablet    Sig: Take 1-2 tablets by mouth every 4 (four) hours as needed.    Dispense:  60 tablet    Refill:  0  . doxycycline (VIBRA-TABS) 100 MG tablet    Sig: Take 1 tablet (100 mg total) by mouth 2 (two) times daily.    Dispense:  28 tablet    Refill:  0      Procedures: No procedures performed   Clinical Data: No additional findings.   Subjective: Chief Complaint  Patient presents with  . Routine Post Op    02/16/17 LT THA-anterior    HPI VictoriaKaufman returns today now 31 days status post left total hip arthroplasty. She states over the weekend that she started having some redness at the distal incision. Overall her hip is doing well. She is able with a cane. She's finished with home health PT. She hasn't for refill on her muscle  relaxer and pain medication today. Review of Systems No fevers or chills. Redness over the distal incision area left hip  Objective: Vital Signs: There were no vitals taken for this visit.  Physical Exam  Constitutional: She is oriented to person, place, and time. She appears well-developed and well-nourished. No distress.  Neurological: She is alert and oriented to person, place, and time.    Ortho Exam Left hip proximal incisions healing well no signs of infection. Distal incision with slight erythema. No expressible purulence. Redness and firmness skin consistent with some fatty necrosis. No significant seroma. Good range of motion of the hip without pain. Left calf supple nontender. Specialty Comments:  No specialty comments available.  Imaging: No results found.   PMFS History: Patient Active Problem List   Diagnosis Date Noted  . Status post total replacement of left hip 02/16/2017  . Pain in left hip 01/15/2017  . Unilateral primary osteoarthritis, left hip 01/15/2017   Past Medical History:  Diagnosis Date  . Arthritis   . Headache    hx of migraines 1 or 2 in life  . History of pneumonia   . Pneumonia     No family history  on file.  Past Surgical History:  Procedure Laterality Date  . CESAREAN SECTION    . CHOLECYSTECTOMY    . TOTAL HIP ARTHROPLASTY Left 02/16/2017   Procedure: LEFT TOTAL HIP ARTHROPLASTY ANTERIOR APPROACH;  Surgeon: Kathryne HitchBlackman, Christopher Y, MD;  Location: [facility];  Service: Orthopedics;  Laterality: Left;   Social History   Occupational History  . Not on file.   Social History Main Topics  . Smoking status: Never Smoker  . Smokeless tobacco: Never Used  . Alcohol use No  . Drug use: No  . Sexual activity: Yes

## 2017-03-26 ENCOUNTER — Ambulatory Visit (INDEPENDENT_AMBULATORY_CARE_PROVIDER_SITE_OTHER): Payer: BLUE CROSS/BLUE SHIELD | Admitting: Orthopaedic Surgery

## 2017-03-26 DIAGNOSIS — Z96642 Presence of left artificial hip joint: Secondary | ICD-10-CM

## 2017-03-26 NOTE — Progress Notes (Signed)
The patient is following up for a left hip incision check. She is a morbidly obese individual who are concerned about her hip incision. She is doing well overall denies any pain. She said the wound is closed over completely.  On examination her wound is closed over completely. There is no redness and I cannot palpate a seroma.  Fortunately things seem to doing well for her. She is walking without assistive device and denies any fever chills or significant pain. I like to see her back in a month to see how she is doing overall but no x-rays are needed. She understands if things worsen in any way I need to see her immediately.

## 2017-04-23 ENCOUNTER — Ambulatory Visit (INDEPENDENT_AMBULATORY_CARE_PROVIDER_SITE_OTHER): Payer: BLUE CROSS/BLUE SHIELD | Admitting: Orthopaedic Surgery

## 2017-04-23 ENCOUNTER — Other Ambulatory Visit (INDEPENDENT_AMBULATORY_CARE_PROVIDER_SITE_OTHER): Payer: Self-pay

## 2017-04-23 DIAGNOSIS — Z96642 Presence of left artificial hip joint: Secondary | ICD-10-CM

## 2017-04-23 MED ORDER — NABUMETONE 500 MG PO TABS: 500.0000 mg | ORAL_TABLET | Freq: Two times a day (BID) | ORAL | 1 refills | Status: AC | PRN

## 2017-04-23 MED ORDER — HYDROCODONE-ACETAMINOPHEN 5-325 MG PO TABS: 1.0000 | ORAL_TABLET | Freq: Three times a day (TID) | ORAL | 0 refills | Status: DC | PRN

## 2017-04-23 NOTE — Progress Notes (Signed)
The patient is now almost 9 weeks post a left total hip arthroplasty. She is someone who is morbidly obese and feels that she is not steady on her feet yet.  She is walking without assistive device and I can put her hip easily through range of motion. Her left hip incision is healed completely. Her is no redness or induration around the skin. Her leg lengths are equal.  We can send her to outpatient physical therapy to work on strengthening of her hips as well as gait training and mobility in general and core strengthening. I did provide her a procedure for hydrocodone today and will send in some Relafen to her pharmacy for inflammation. I'll see her back in 4 weeks how she doing overall.

## 2017-05-01 ENCOUNTER — Ambulatory Visit: Payer: BLUE CROSS/BLUE SHIELD | Attending: Orthopaedic Surgery

## 2017-05-01 DIAGNOSIS — M25552 Pain in left hip: Secondary | ICD-10-CM | POA: Diagnosis present

## 2017-05-01 DIAGNOSIS — M25562 Pain in left knee: Secondary | ICD-10-CM

## 2017-05-01 DIAGNOSIS — R262 Difficulty in walking, not elsewhere classified: Secondary | ICD-10-CM

## 2017-05-01 DIAGNOSIS — M6281 Muscle weakness (generalized): Secondary | ICD-10-CM

## 2017-05-01 NOTE — Therapy (Signed)
[facility] Outpatient Rehabilitation [facility] [address] [place], [place], [address] Phone: [phone]   Fax:  [phone]  Physical Therapy Evaluation  Patient Details  Name: Victoria Kaufman MRN: [id number] Date of Birth: [birth year] Referring Provider: Doneen Poisson, MD  Encounter Date: 05/01/2017      PT End of Session - 05/01/17 1131    Visit Number 1   Number of Visits 12   Date for PT Re-Evaluation 06/08/17   Authorization Type BCBS   PT Start Time 1130   PT Stop Time 1225   PT Time Calculation (min) 55 min   Activity Tolerance Patient tolerated treatment well   Behavior During Therapy [name] for tasks assessed/performed      Past Medical History:  Diagnosis Date  . Arthritis   . Headache    hx of migraines 1 or 2 in life  . History of pneumonia   . Pneumonia     Past Surgical History:  Procedure Laterality Date  . CESAREAN SECTION    . CHOLECYSTECTOMY    . TOTAL HIP ARTHROPLASTY Left 02/16/2017   Procedure: LEFT TOTAL HIP ARTHROPLASTY ANTERIOR APPROACH;  Surgeon: Kathryne Hitch, MD;  Location: [facility];  Service: Orthopedics;  Laterality: Left;    There were no vitals filed for this visit.       Subjective Assessment - 05/01/17 1137    Subjective She reports she is now post THA Lt and leg not as strong . Needs to be on feet 10 hours  for work.  Tries to stand at home and able to stand for 2 hours max.   Doing HEP from hospital.   She was walking with cane 4 months prior to surgery and hip pain for a year prior to surgery   Limitations Standing;Walking   How long can you sit comfortably? As needed   How long can you stand comfortably? 2 hours max   How long can you walk comfortably? 45 min   Patient Stated Goals Stand for 10 hours, improve strength   Currently in Pain? No/denies   Pain Score 5   MAX pain   Pain Location --  LT thigh and calf   Pain Orientation Left   Pain Descriptors / Indicators Aching;Throbbing   Pain Type --  sub acute   Pain Onset 1 to 4 weeks ago   Pain Frequency Intermittent   Aggravating Factors  Being on feet, walking stairs   Pain Relieving Factors rest             [facility] PT Assessment - 05/01/17 0001      Assessment   Medical Diagnosis Lt  hip THA   Referring Provider Doneen Poisson, MD   Onset Date/Surgical Date 02/16/17   Next MD Visit 05/21/17   Prior Therapy Inpatient      Precautions   Precautions None     Restrictions   Weight Bearing Restrictions No     Balance Screen   Has the patient fallen in the past 6 months No     Home Environment   Living Environment Private residence   Living Arrangements Children   Type of Home House   Home Access Level entry   Home Layout Two level   Alternate Level Stairs-Number of Steps 15   Alternate Level Stairs-Rails Left     Prior Function   Level of Independence Independent;Needs assistance with homemaking   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     Cognition   Overall Cognitive Status  Within Functional Limits for tasks assessed     ROM / Strength   AROM / PROM / Strength AROM;Strength     AROM   Overall AROM Comments Knee and ankle WNL     Strength   Right/Left Hip Right;Left   Right Hip Flexion 5/5   Right Hip Extension 4/5   Right Hip External Rotation  5/5   Right Hip Internal Rotation 5/5   Right Hip ABduction 4/5   Left Hip Flexion 3+/5   Left Hip Extension 3-/5   Left Hip External Rotation 4/5   Left Hip Internal Rotation 4/5   Left Hip ABduction 3-/5   Left Hip ADduction 4/5   Right/Left Knee Right;Left   Right Knee Flexion 5/5   Right Knee Extension 5/5   Left Knee Flexion 5/5   Left Knee Extension 5/5     Ambulation/Gait   Assistive device None   Ambulation Surface Level;Indoor   Gait Comments step through with trunk lean to Lt      Balance   Balance Assessed --  single leg RT 10 sec Lt 2 sec            Objective measurements completed on  examination: See above findings.                  PT Education - 05/01/17 1134    Education provided Yes   Education Details POC   Person(s) Educated Patient   Methods Explanation   Comprehension Verbalized understanding          PT Short Term Goals - 05/01/17 1133      PT SHORT TERM GOAL #1   Title She will be independent with initial  HEP   Time 3   Period Weeks   Status New           PT Long Term Goals - 05/01/17 1133      PT LONG TERM GOAL #1   Title She will be independent with all HEP issued   Time 6   Period Weeks   Status New     PT LONG TERM GOAL #2   Title She will report improved stability on feet being able to walk without device  100% of time.    Time 6   Period Weeks   Status New     PT LONG TERM GOAL #3   Title Hip strength improved to min 4/10 to provide stability with wlaking and standing for work   Time 6   Period Weeks   Status New     PT LONG TERM GOAL #4   Title She will be able to perform all hoe tasks without assist   Time 6   Period Weeks   Status New     PT LONG TERM GOAL #5   Title She will be able to access up stairs in home with min effort.    Time 6   Period Weeks   Status New     Additional Long Term Goals   Additional Long Term Goals Yes     PT LONG TERM GOAL #6   Title She will report incident of LT thigh and knee pain decr 50% or more   Period Weeks   Status New                Plan - 05/01/17 1132    Clinical Impression Statement Victoria Kaufman is post 2 months THA with reports of weakness and instability on feet.  It appears weakness of LT hip is primary reason for balance and walking difficulty.  She should improve with strength improvements   Clinical Presentation Stable   Clinical Presentation due to: weakness of LT hip post THA   Clinical Decision Making Low   Rehab Potential Good   PT Frequency 2x / week   PT Duration 6 weeks   PT Treatment/Interventions Moist  Heat;Cryotherapy;Patient/family education;Therapeutic exercise;Manual techniques;Gait training   PT Next Visit Plan Add resistance to exer, balance, nustep, HEP   Consulted and Agree with Plan of Care Patient      Patient will benefit from skilled therapeutic intervention in order to improve the following deficits and impairments:  Decreased activity tolerance, Pain, Decreased strength, Difficulty walking  Visit Diagnosis: Pain in left hip  Left knee pain, unspecified chronicity  Difficulty in walking, not elsewhere classified  Muscle weakness (generalized)     Problem List Patient Active Problem List   Diagnosis Date Noted  . Status post total replacement of left hip 02/16/2017  . Pain in left hip 01/15/2017  . Unilateral primary osteoarthritis, left hip 01/15/2017    Victoria Kaufman, Victoria Kaufman  PT 05/01/2017, 12:26 PM  [facility] [address] [place], [place], [address] Phone: [phone]   Fax:  [phone]  Name: Victoria Kaufman MRN: [id number] Date of Birth: 1970/12/13

## 2017-05-03 ENCOUNTER — Ambulatory Visit: Payer: BLUE CROSS/BLUE SHIELD

## 2017-05-03 DIAGNOSIS — M25552 Pain in left hip: Secondary | ICD-10-CM

## 2017-05-03 DIAGNOSIS — R262 Difficulty in walking, not elsewhere classified: Secondary | ICD-10-CM

## 2017-05-03 DIAGNOSIS — M6281 Muscle weakness (generalized): Secondary | ICD-10-CM

## 2017-05-03 DIAGNOSIS — M25562 Pain in left knee: Secondary | ICD-10-CM

## 2017-05-03 NOTE — Patient Instructions (Addendum)
Hip Extension: Hamstring Single Leg Deadlift (Eccentric)   Holding weights, stand on affected leg with knee slightly flexed. Lift other leg while slowly bending forward at the hip. Use ___ lb weight. ___ reps per set, ___ sets per day, ___ days per week. Add ___ lbs when you achieve ___ repetitions. Touch floor with weight.  Copyright  VHI. All rights reserved.  Squat: Wide Leg   Feet wide apart, toes out, squat, holding weight between knees. Weight should be at ankles.  Bend at the hips with hips going back behind hips. Keep back straight.  Put eight on stool if you cannot get to floor.  Repeat _3-15___ times per set. Do ___1-2_ sets per session. Do __2-5__ sessions per week. Use __0-10__ lb weight.  Copyright  VHI. All rights reserved.  Squat: Half   Arms hanging at sides, squat by dropping hips back as if sitting on a chair. Keep knees over ankles, hips behind heels.  Keep back straight.  Bend at hips and knees will bend with hips.   Repeat ___3-15_ times per set. Do __1-2__ sets per session. Do __2-5__ sessions per week. Use __0-10HIP / KNEE: Flexion / Extension, Squat Unilateral Hip / Glute Extension: Standing - Straight Leg (Machine) Hip Extension (Standing) Healthy Back - Yoga Tree Balance ALSO Bridging , SLR LT all but adduction  Sit to stand  10-15 reps  1-2x daily

## 2017-05-03 NOTE — Therapy (Signed)
[facility] [address] [place], [place], [address] Phone: [phone]   Fax:  [phone]  Physical Therapy Treatment  Patient Details  Name: Taneil Lazarus MRN: [id number] Date of Birth: [birth year] Referring Provider: Doneen Poisson, MD  Encounter Date: 05/03/2017      PT End of Session - 05/03/17 1340    Visit Number 2   Number of Visits 12   Date for PT Re-Evaluation 06/08/17   Authorization Type BCBS   PT Start Time 0130  She was here but not signed in   PT Stop Time 0215   PT Time Calculation (min) 45 min   Activity Tolerance Patient tolerated treatment well   Behavior During Therapy [facility] for tasks assessed/performed      Past Medical History:  Diagnosis Date  . Arthritis   . Headache    hx of migraines 1 or 2 in life  . History of pneumonia   . Pneumonia     Past Surgical History:  Procedure Laterality Date  . CESAREAN SECTION    . CHOLECYSTECTOMY    . TOTAL HIP ARTHROPLASTY Left 02/16/2017   Procedure: LEFT TOTAL HIP ARTHROPLASTY ANTERIOR APPROACH;  Surgeon: Kathryne Hitch, MD;  [facility];  Service: Orthopedics;  Laterality: Left;    There were no vitals filed for this visit.      Subjective Assessment - 05/03/17 1339    Subjective No changes. Took pain med 60 min ago PAin 4/10 then   Currently in Pain? No/denies         Established HEP See the written copy of this report in the patient's paper medical record.  These results did not interface directly into the electronic medical record and are summarized here. Pt instructions for specifics                        PT Education - 05/03/17 1413    Education provided Yes   Education Details HEP   Person(s) Educated Patient   Methods Explanation;Demonstration;Tactile cues;Verbal cues;Handout   Comprehension Returned demonstration;Verbalized understanding          PT Short Term Goals - 05/01/17 1133      PT SHORT TERM GOAL #1   Title She will be independent with initial  HEP   Time 3   Period Weeks   Status New           PT Long Term Goals - 05/01/17 1133      PT LONG TERM GOAL #1   Title She will be independent with all HEP issued   Time 6   Period Weeks   Status New     PT LONG TERM GOAL #2   Title She will report improved stability on feet being able to walk without device  100% of time.    Time 6   Period Weeks   Status New     PT LONG TERM GOAL #3   Title Hip strength improved to min 4/10 to provide stability with wlaking and standing for work   Time 6   Period Weeks   Status New     PT LONG TERM GOAL #4   Title She will be able to perform all hoe tasks without assist   Time 6   Period Weeks   Status New     PT LONG TERM GOAL #5   Title She will be able to access up stairs in home with min effort.  Time 6   Period Weeks   Status New     Additional Long Term Goals   Additional Long Term Goals Yes     PT LONG TERM GOAL #6   Title She will report incident of LT thigh and knee pain decr 50% or more   Period Weeks   Status New     PT LONG TERM GOAL #7   Title FOTO score will improve to < 40% limited to demo improved function   Time 6   Period Weeks   Status New               Plan - 05/03/17 1415    Clinical Impression Statement Fatigued but no incr pain.  Agreed to do HEp . Progress exer as able   PT Treatment/Interventions Moist Heat;Cryotherapy;Patient/family education;Therapeutic exercise;Manual techniques;Gait training   PT Next Visit Plan progress exercise as tolerated   PT Home Exercise Plan SLR , bridge , sit to stand , single leg hip hinge   Consulted and Agree with Plan of Care Patient      Patient will benefit from skilled therapeutic intervention in order to improve the following deficits and impairments:  Decreased activity tolerance, Pain, Decreased strength, Difficulty walking  Visit Diagnosis: Pain in left hip  Left  knee pain, unspecified chronicity  Difficulty in walking, not elsewhere classified  Muscle weakness (generalized)     Problem List Patient Active Problem List   Diagnosis Date Noted  . Status post total replacement of left hip 02/16/2017  . Pain in left hip 01/15/2017  . Unilateral primary osteoarthritis, left hip 01/15/2017    Caprice RedChasse, Maymie Brunke M,  PT 05/03/2017, 2:17 PM  [facility] [address] [place], [place], [address] Phone: [phone]   Fax:  [phone]  Name: Berdine AddisonLinda Ferreri MRN: [id number] Date of Birth: 1971-05-03

## 2017-05-08 ENCOUNTER — Ambulatory Visit: Payer: BLUE CROSS/BLUE SHIELD

## 2017-05-08 DIAGNOSIS — R262 Difficulty in walking, not elsewhere classified: Secondary | ICD-10-CM

## 2017-05-08 DIAGNOSIS — M25552 Pain in left hip: Secondary | ICD-10-CM

## 2017-05-08 DIAGNOSIS — M6281 Muscle weakness (generalized): Secondary | ICD-10-CM

## 2017-05-08 DIAGNOSIS — M25562 Pain in left knee: Secondary | ICD-10-CM

## 2017-05-08 NOTE — Therapy (Signed)
[facility] [address] [place], [place], [address] Phone: [phone]   Fax:  [phone]  Physical Therapy Treatment  Patient Details  Name: Victoria AddisonLinda Kaufman MRN: [id number] Date of Birth: 1971-01-25 Referring Provider: Doneen Poissonhristopher Blackman, MD  Encounter Date: 05/08/2017      PT End of Session - 05/08/17 1039    Visit Number 3   Number of Visits 12   Date for PT Re-Evaluation 06/08/17   Authorization Type BCBS   PT Start Time 1030  here late    PT Stop Time 1115   PT Time Calculation (min) 45 min   Activity Tolerance Patient tolerated treatment well   Behavior [facility] Therapy [facility] for tasks assessed/performed      Past Medical History:  Diagnosis Date  . Arthritis   . Headache    hx of migraines 1 or 2 in life  . History of pneumonia   . Pneumonia     Past Surgical History:  Procedure Laterality Date  . CESAREAN SECTION    . CHOLECYSTECTOMY    . TOTAL HIP ARTHROPLASTY Left 02/16/2017   Procedure: LEFT TOTAL HIP ARTHROPLASTY ANTERIOR APPROACH;  Surgeon: Kathryne HitchBlackman, Christopher Y, MD;  [facility];  Service: Orthopedics;  Laterality: Left;    There were no vitals filed for this visit.      Subjective Assessment - 05/08/17 1037    Subjective Lateral LT thigh sore   Currently in Pain? Yes   Pain Score 3    Pain Location Hip  lateral thigh   Pain Orientation Left   Pain Descriptors / Indicators Sore   Pain Type --  sub acute   Pain Onset 1 to 4 weeks ago   Pain Frequency Intermittent                         [facility] Adult PT Treatment/Exercise - 05/08/17 0001      Knee/Hip Exercises: Aerobic   Nustep L5 UE/LE  6 min     Knee/Hip Exercises: Supine   Bridges Both;15 reps   Straight Leg Raises Left   Straight Leg Raises Limitations 12   Other Supine Knee/Hip Exercises PPT x10 5 sec then x 5 bent knee raises x 5 RT/LT     Modalities   Modalities Moist Heat     Moist Heat Therapy   Number Minutes Moist Heat 15 Minutes   Moist Heat Location Hip  lateral thigh     Manual Therapy   Manual Therapy Soft tissue mobilization   Soft tissue mobilization lateral lt thigh and proximal rthigh at glutes and TFL                 PT Education - 05/08/17 1106    Education provided Yes   Education Details use of heat for soreness   Person(s) Educated Patient   Methods Explanation   Comprehension Verbalized understanding          PT Short Term Goals - 05/01/17 1133      PT SHORT TERM GOAL #1   Title She will be independent with initial  HEP   Time 3   Period Weeks   Status New           PT Long Term Goals - 05/01/17 1133      PT LONG TERM GOAL #1   Title She will be independent with all HEP issued   Time 6   Period Weeks   Status New  PT LONG TERM GOAL #2   Title She will report improved stability on feet being able to walk without device  100% of time.    Time 6   Period Weeks   Status New     PT LONG TERM GOAL #3   Title Hip strength improved to min 4/10 to provide stability with wlaking and standing for work   Time 6   Period Weeks   Status New     PT LONG TERM GOAL #4   Title She will be able to perform all hoe tasks without assist   Time 6   Period Weeks   Status New     PT LONG TERM GOAL #5   Title She will be able to access up stairs in home with min effort.    Time 6   Period Weeks   Status New     Additional Long Term Goals   Additional Long Term Goals Yes     PT LONG TERM GOAL #6   Title She will report incident of LT thigh and knee pain decr 50% or more   Period Weeks   Status New     PT LONG TERM GOAL #7   Title FOTO score will improve to < 40% limited to demo improved function   Time 6   Period Weeks   Status New               Plan - 05/08/17 1107    Clinical Impression Statement Continues with pain . Pain eased with STW with roller . She was late so limited activity   PT Treatment/Interventions  Moist Heat;Cryotherapy;Patient/family education;Therapeutic exercise;Manual techniques;Gait training   PT Next Visit Plan progress exercise as tolerated, continue heat as helpful   PT Home Exercise Plan SLR , bridge , sit to stand , single leg hip hinge   Consulted and Agree with Plan of Care Patient      Patient will benefit from skilled therapeutic intervention in order to improve the following deficits and impairments:  Decreased activity tolerance, Pain, Decreased strength, Difficulty walking  Visit Diagnosis: Pain in left hip  Left knee pain, unspecified chronicity  Difficulty in walking, not elsewhere classified  Muscle weakness (generalized)     Problem List Patient Active Problem List   Diagnosis Date Noted  . Status post total replacement of left hip 02/16/2017  . Pain in left hip 01/15/2017  . Unilateral primary osteoarthritis, left hip 01/15/2017    Caprice Red PT 05/08/2017, 11:09 AM  [facility] [address] [place], [place], [address] Phone: [phone]   Fax:  [phone]  Name: Victoria Kaufman MRN: [id number] Date of Birth: 12/28/1970

## 2017-05-10 ENCOUNTER — Ambulatory Visit: Payer: BLUE CROSS/BLUE SHIELD | Attending: Orthopaedic Surgery

## 2017-05-10 DIAGNOSIS — M25552 Pain in left hip: Secondary | ICD-10-CM | POA: Diagnosis not present

## 2017-05-10 DIAGNOSIS — M25562 Pain in left knee: Secondary | ICD-10-CM | POA: Diagnosis present

## 2017-05-10 DIAGNOSIS — M6281 Muscle weakness (generalized): Secondary | ICD-10-CM | POA: Diagnosis present

## 2017-05-10 DIAGNOSIS — R262 Difficulty in walking, not elsewhere classified: Secondary | ICD-10-CM | POA: Diagnosis present

## 2017-05-10 NOTE — Therapy (Signed)
[facility] [address] [place], [place], [address] Phone: [phone]   Fax:  [phone]  Physical Therapy Treatment  Patient Details  Name: Anzlee Hinesley MRN: [id number] Date of Birth: 05-01-1971 Referring Provider: Doneen Poisson, MD  Encounter Date: 05/10/2017      PT End of Session - 05/10/17 1101    Visit Number 4   Number of Visits 12   Date for PT Re-Evaluation 06/08/17   Authorization Type BCBS   PT Start Time 1023   PT Stop Time 1115   PT Time Calculation (min) 52 min   Activity Tolerance Patient tolerated treatment well   Behavior During Therapy [facility] for tasks assessed/performed      Past Medical History:  Diagnosis Date  . Arthritis   . Headache    hx of migraines 1 or 2 in life  . History of pneumonia   . Pneumonia     Past Surgical History:  Procedure Laterality Date  . CESAREAN SECTION    . CHOLECYSTECTOMY    . TOTAL HIP ARTHROPLASTY Left 02/16/2017   Procedure: LEFT TOTAL HIP ARTHROPLASTY ANTERIOR APPROACH;  Surgeon: Kathryne Hitch, MD;  [facility];  Service: Orthopedics;  Laterality: Left;    There were no vitals filed for this visit.      Subjective Assessment - 05/10/17 1026    Subjective Doing better . Lateral LT thigh pain   Currently in Pain? Yes   Pain Score 2    Pain Location --  LT thigh                         [facility] Adult PT Treatment/Exercise - 05/10/17 0001      Knee/Hip Exercises: Aerobic   Nustep L5 UE/LE  5 min     Knee/Hip Exercises: Supine   Bridges Limitations x12 with marching RT /LT  verbal anc tactile cues for  abdominal  recruitment and alighnment   Straight Leg Raises Left   Straight Leg Raises Limitations 12     Knee/Hip Exercises: Sidelying   Hip ABduction Left;15 reps   Clams x15 and x 15 reverse clams     Moist Heat Therapy   Number Minutes Moist Heat 15 Minutes   Moist Heat Location Hip     Manual Therapy   Soft tissue mobilization lateral lt thigh and proximal LT thigh at glutes and TFL                 PT Education - 05/10/17 1100    Education provided Yes   Education Details expectation for progress and to be patient as only 2.5 months out from [employer]) Educated Patient   Methods Explanation   Comprehension Verbalized understanding          PT Short Term Goals - 05/01/17 1133      PT SHORT TERM GOAL #1   Title She will be independent with initial  HEP   Time 3   Period Weeks   Status New           PT Long Term Goals - 05/01/17 1133      PT LONG TERM GOAL #1   Title She will be independent with all HEP issued   Time 6   Period Weeks   Status New     PT LONG TERM GOAL #2   Title She will report improved stability on feet being able to walk without device  100% of time.    Time 6   Period Weeks   Status New     PT LONG TERM GOAL #3   Title Hip strength improved to min 4/10 to provide stability with wlaking and standing for work   Time 6   Period Weeks   Status New     PT LONG TERM GOAL #4   Title She will be able to perform all hoe tasks without assist   Time 6   Period Weeks   Status New     PT LONG TERM GOAL #5   Title She will be able to access up stairs in home with min effort.    Time 6   Period Weeks   Status New     Additional Long Term Goals   Additional Long Term Goals Yes     PT LONG TERM GOAL #6   Title She will report incident of LT thigh and knee pain decr 50% or more   Period Weeks   Status New     PT LONG TERM GOAL #7   Title FOTO score will improve to < 40% limited to demo improved function   Time 6   Period Weeks   Status New               Plan - 05/10/17 1103    Clinical Impression Statement Sore post exercises though did well with all .   She is deconditioned and exercise will make her sore so we will modify as needed   PT Treatment/Interventions Moist Heat;Cryotherapy;Patient/family  education;Therapeutic exercise;Manual techniques;Gait training   PT Next Visit Plan progress exercise as tolerated, continue heat as helpful, standing exercises   PT Home Exercise Plan SLR , bridge , sit to stand , single leg hip hinge   Consulted and Agree with Plan of Care Patient      Patient will benefit from skilled therapeutic intervention in order to improve the following deficits and impairments:  Decreased activity tolerance, Pain, Decreased strength, Difficulty walking  Visit Diagnosis: Pain in left hip  Left knee pain, unspecified chronicity  Difficulty in walking, not elsewhere classified  Muscle weakness (generalized)     Problem List Patient Active Problem List   Diagnosis Date Noted  . Status post total replacement of left hip 02/16/2017  . Pain in left hip 01/15/2017  . Unilateral primary osteoarthritis, left hip 01/15/2017    Caprice RedChasse, Rodriguez Aguinaldo M  PT 05/10/2017, 11:05 AM  [facility] [address] [place], [place], [address] Phone: [phone]   Fax:  [phone]  Name: Berdine AddisonLinda Glanz MRN: [id number] Date of Birth: 1971/06/27

## 2017-05-14 ENCOUNTER — Ambulatory Visit: Payer: BLUE CROSS/BLUE SHIELD

## 2017-05-14 DIAGNOSIS — R262 Difficulty in walking, not elsewhere classified: Secondary | ICD-10-CM

## 2017-05-14 DIAGNOSIS — M25562 Pain in left knee: Secondary | ICD-10-CM

## 2017-05-14 DIAGNOSIS — M25552 Pain in left hip: Secondary | ICD-10-CM | POA: Diagnosis not present

## 2017-05-14 DIAGNOSIS — M6281 Muscle weakness (generalized): Secondary | ICD-10-CM

## 2017-05-14 NOTE — Therapy (Signed)
[facility] Outpatient Rehabilitation  [facility] [address] [place], [place], [address] Phone: [phone]   Fax:  [phone]  Physical Therapy Treatment  Patient Details  Name: Victoria Kaufman MRN: [id number] Date of Birth: [birth year] Referring Provider: Doneen Poisson, MD   Encounter Date: 05/14/2017  PT End of Session - 05/14/17 1109    Visit Number  5    Number of Visits  12    Date for PT Re-Evaluation  06/08/17    PT Start Time  1103    PT Stop Time  1145    PT Time Calculation (min)  42 min    Activity Tolerance  Patient tolerated treatment well    Behavior During Therapy  [facility] for tasks assessed/performed       Past Medical History:  Diagnosis Date  . Arthritis   . Headache    hx of migraines 1 or 2 in life  . History of pneumonia   . Pneumonia     Past Surgical History:  Procedure Laterality Date  . CESAREAN SECTION    . CHOLECYSTECTOMY      There were no vitals filed for this visit.  Subjective Assessment - 05/14/17 1103    Currently in Pain?  Yes    Pain Score  2     Pain Location  -- lateral LT thigh   lateral LT thigh   Pain Orientation  Left    Pain Descriptors / Indicators  Sore    Pain Type  -- sub acute   sub acute   Pain Frequency  Intermittent    Aggravating Factors   weight bdearing activ ity    Pain Relieving Factors  rest    Multiple Pain Sites  No                      [facility] Adult PT Treatment/Exercise - 05/14/17 0001      Knee/Hip Exercises: Aerobic   Nustep  L5 LE  6 min      Knee/Hip Exercises: Supine   Bridges  Both;15 reps    Bridges Limitations  3 sets of 3 reps adduct/abduct    Straight Leg Raises  Left    Straight Leg Raises Limitations  12    Other Supine Knee/Hip Exercises  PPT x 12 5 sec then x 5 bent knee raises x 5 RT/LT      Knee/Hip Exercises: Sidelying   Hip ABduction  Left;10 reps    Clams  2 x10 and x 10 reverse clams       STW : with roller to Lt glutes and lateral  quads and hamstrings        PT Short Term Goals - 05/14/17 1158      PT SHORT TERM GOAL #1   Title  She will be independent with initial  HEP    Status  Achieved        PT Long Term Goals - 05/01/17 1133      PT LONG TERM GOAL #1   Title  She will be independent with all HEP issued    Time  6    Period  Weeks    Status  New      PT LONG TERM GOAL #2   Title  She will report improved stability on feet being able to walk without device  100% of time.     Time  6    Period  Weeks    Status  New  PT LONG TERM GOAL #3   Title  Hip strength improved to min 4/10 to provide stability with wlaking and standing for work    Time  6    Period  Weeks    Status  New      PT LONG TERM GOAL #4   Title  She will be able to perform all hoe tasks without assist    Time  6    Period  Weeks    Status  New      PT LONG TERM GOAL #5   Title  She will be able to access up stairs in home with min effort.     Time  6    Period  Weeks    Status  New      Additional Long Term Goals   Additional Long Term Goals  Yes      PT LONG TERM GOAL #6   Title  She will report incident of LT thigh and knee pain decr 50% or more    Period  Weeks    Status  New      PT LONG TERM GOAL #7   Title  FOTO score will improve to < 40% limited to demo improved function    Time  6    Period  Weeks    Status  New            Plan - 05/14/17 1110    Clinical Impression Statement  Declined heat as only slightly sore with exercises.  She she noted apptionears to be doing her HEP as she is able to do the exer in clinic correctly.  Pain may be related to nerve   irritation. She notes Lt glutes not recruited  as much as RT and cued to consciously contract on Lt.      PT Treatment/Interventions  Moist Heat;Cryotherapy;Patient/family education;Therapeutic exercise;Manual techniques;Gait training    PT Next Visit Plan  progress exercise as tolerated, continue heat as helpful, standing exercises    PT  Home Exercise Plan  SLR , bridge , sit to stand , single leg hip hinge    Consulted and Agree with Plan of Care  Patient       Patient will benefit from skilled therapeutic intervention in order to improve the following deficits and impairments:  Decreased activity tolerance, Pain, Decreased strength, Difficulty walking  Visit Diagnosis: Pain in left hip  Left knee pain, unspecified chronicity  Difficulty in walking, not elsewhere classified  Muscle weakness (generalized)     Problem List Patient Active Problem List   Diagnosis Date Noted  . Status post total replacement of left hip 02/16/2017  . Pain in left hip 01/15/2017  . Unilateral primary osteoarthritis, left hip 01/15/2017    Caprice RedChasse, Andreka Stucki M  PT 05/14/2017, 12:02 PM  [facility] [address] [place], [place], [address] Phone: [phone]   Fax:  [phone]  Name: Victoria Kaufman MRN: [id number] Date of Birth: 06/01/1971

## 2017-05-17 ENCOUNTER — Ambulatory Visit: Payer: BLUE CROSS/BLUE SHIELD

## 2017-05-17 DIAGNOSIS — R262 Difficulty in walking, not elsewhere classified: Secondary | ICD-10-CM

## 2017-05-17 DIAGNOSIS — M25552 Pain in left hip: Secondary | ICD-10-CM | POA: Diagnosis not present

## 2017-05-17 DIAGNOSIS — M6281 Muscle weakness (generalized): Secondary | ICD-10-CM

## 2017-05-17 DIAGNOSIS — M25562 Pain in left knee: Secondary | ICD-10-CM

## 2017-05-17 NOTE — Patient Instructions (Signed)
Issued standing face wall with reach and glut contraction  Daily x 10-20 reps Reach  Both then RT then LT hands reach,,   Rotational stepping  With step foot 45 degrees with pausing at each movement with moving feet apart and together concentrate on pausing

## 2017-05-17 NOTE — Therapy (Signed)
[facility] [address] [place], [place], [address] Phone: [phone]   Fax:  [phone]  Physical Therapy Treatment  Patient Details  Name: Victoria AddisonLinda Kaufman MRN: [id number] Date of Birth: 1970/11/11 Referring Provider: Doneen Poissonhristopher Blackman, MD   Encounter Date: 05/17/2017  PT End of Session - 05/17/17 1115    Visit Number  6    Number of Visits  12    Date for PT Re-Evaluation  06/08/17    Authorization Type  BCBS    PT Start Time  1114 PT late for 11 AM appointment   PT late for 11 AM appointment   PT Stop Time  1145    PT Time Calculation (min)  31 min    Activity Tolerance  Patient tolerated treatment well    Behavior During Therapy  [facility] for tasks assessed/performed       Past Medical History:  Diagnosis Date  . Arthritis   . Headache    hx of migraines 1 or 2 in life  . History of pneumonia   . Pneumonia     Past Surgical History:  Procedure Laterality Date  . CESAREAN SECTION    . CHOLECYSTECTOMY      There were no vitals filed for this visit.  Subjective Assessment - 05/17/17 1116    Subjective  no pain , stumbled in store without fall.   Need to get my balance back    Currently in Pain?  No/denies                      [facility] Adult PT Treatment/Exercise - 05/17/17 0001      Neuro Re-ed    Neuro Re-ed Details   balnvce work with stepping RT and Lt with foot at 45 degrees on stepping foot  then weight shirt to stepping leg then step to foot with stance leg , cued to pause at each movement. Also did backward stepping .       Knee/Hip Exercises: Aerobic   Nustep  L5 LE  6 min      Knee/Hip Exercises: Standing   Forward Step Up  Left;20 reps;Hand Hold: 1;Step Height: 8"    Other Standing Knee Exercises  at wall and reaching with RT and LT leg with cue to contract glut  on LT and lift RT foot for strength and balance.              PT Education - 05/17/17 1158    Education provided   Yes    Education Details  rotational stepping and faceing wall reaching    Person(s) Educated  Patient    Methods  Explanation;Demonstration;Tactile cues;Verbal cues;Handout    Comprehension  Returned demonstration;Verbalized understanding       PT Short Term Goals - 05/14/17 1158      PT SHORT TERM GOAL #1   Title  She will be independent with initial  HEP    Status  Achieved        PT Long Term Goals - 05/17/17 1200      PT LONG TERM GOAL #1   Title  She will be independent with all HEP issued    Status  On-going      PT LONG TERM GOAL #2   Title  She will report improved stability on feet being able to walk without device  100% of time.     Status  On-going      PT LONG TERM GOAL #3  Title  Hip strength improved to min 4/10 to provide stability with wlaking and standing for work    Status  Unable to assess      PT LONG TERM GOAL #4   Title  She will be able to perform all home tasks without assist    Status  On-going      PT LONG TERM GOAL #5   Title  She will be able to access up stairs in home with min effort.     Status  On-going      PT LONG TERM GOAL #6   Title  She will report incident of LT thigh and knee pain decr 50% or more    Status  On-going      PT LONG TERM GOAL #7   Title  FOTO score will improve to < 40% limited to demo improved function    Status  Unable to assess            Plan - 05/17/17 1116    Clinical Impression Statement  She is doing better but weakness and pain still limiting tolerance on feet.       PT Treatment/Interventions  Moist Heat;Cryotherapy;Patient/family education;Therapeutic exercise;Manual techniques;Gait training    PT Next Visit Plan  progress exercise as tolerated, continue heat as helpful, standing exercises    PT Home Exercise Plan  SLR , bridge , sit to stand , single leg hip hinge,  face wall reaching , stepping rotationally.     Consulted and Agree with Plan of Care  Patient       Patient will benefit  from skilled therapeutic intervention in order to improve the following deficits and impairments:  Decreased activity tolerance, Pain, Decreased strength, Difficulty walking  Visit Diagnosis: Left knee pain, unspecified chronicity  Pain in left hip  Difficulty in walking, not elsewhere classified  Muscle weakness (generalized)     Problem List Patient Active Problem List   Diagnosis Date Noted  . Status post total replacement of left hip 02/16/2017  . Pain in left hip 01/15/2017  . Unilateral primary osteoarthritis, left hip 01/15/2017    Caprice RedChasse, Mattias Walmsley M  PT 05/17/2017, 12:03 PM  [facility] [address] [place], [place], [address] Phone: [phone]   Fax:  [phone]  Name: Victoria AddisonLinda Kaufman MRN: [id number] Date of Birth: 22-Apr-1971

## 2017-05-21 ENCOUNTER — Ambulatory Visit (INDEPENDENT_AMBULATORY_CARE_PROVIDER_SITE_OTHER): Payer: BLUE CROSS/BLUE SHIELD | Admitting: Orthopaedic Surgery

## 2017-05-21 ENCOUNTER — Encounter (INDEPENDENT_AMBULATORY_CARE_PROVIDER_SITE_OTHER): Payer: Self-pay | Admitting: Orthopaedic Surgery

## 2017-05-21 DIAGNOSIS — Z96642 Presence of left artificial hip joint: Secondary | ICD-10-CM

## 2017-05-21 MED ORDER — HYDROCODONE-ACETAMINOPHEN 5-325 MG PO TABS: 1.0000 | ORAL_TABLET | Freq: Three times a day (TID) | ORAL | 0 refills | Status: AC | PRN

## 2017-05-21 NOTE — Progress Notes (Signed)
The patient is now 3 months status post a left total hip arthroplasty through direct anterior approach.  She has been going to outpatient physical therapy due to difficulties with strength balance and coordination.  She is doing well otherwise.  On exam her incisions well-healed.  I can easily put her hip to internal and external rotation without any difficulty.  She says she feels much much better than she did before surgery.  At this point I do not need to see her back for 3 months.  At that visit I would like a low AP pelvis and lateral of her left operative hip.  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

## 2017-05-22 ENCOUNTER — Ambulatory Visit: Payer: BLUE CROSS/BLUE SHIELD

## 2017-05-22 DIAGNOSIS — M25552 Pain in left hip: Secondary | ICD-10-CM | POA: Diagnosis not present

## 2017-05-22 DIAGNOSIS — R262 Difficulty in walking, not elsewhere classified: Secondary | ICD-10-CM

## 2017-05-22 DIAGNOSIS — M6281 Muscle weakness (generalized): Secondary | ICD-10-CM

## 2017-05-22 DIAGNOSIS — M25562 Pain in left knee: Secondary | ICD-10-CM

## 2017-05-22 NOTE — Therapy (Signed)
[facility] [address] [place], [place], [address] Phone: [phone]   Fax:  [phone]  Physical Therapy Treatment  Patient Details  Name: Victoria Kaufman MRN: [id number] Date of Birth: 12-30-1970 Referring Provider: Doneen Poissonhristopher Blackman, MD   Encounter Date: 05/22/2017  PT End of Session - 05/22/17 1055    Visit Number  7    Number of Visits  12    Date for PT Re-Evaluation  06/08/17    Authorization Type  BCBS    PT Start Time  1100    PT Stop Time  1200    PT Time Calculation (min)  60 min    Activity Tolerance  Patient tolerated treatment well    Behavior During Therapy  [facility] for tasks assessed/performed       Past Medical History:  Diagnosis Date  . Arthritis   . Headache    hx of migraines 1 or 2 in life  . History of pneumonia   . Pneumonia     Past Surgical History:  Procedure Laterality Date  . CESAREAN SECTION    . CHOLECYSTECTOMY      There were no vitals filed for this visit.  Subjective Assessment - 05/22/17 1103    Subjective  Stiff and sore due to wet /cold weather. MD said ROM good and alignment good. Cont PT. Worries about balance. She is frustrated with lack of progress though I suggested she was doing better and time  and persistance will improve outcome.     Currently in Pain?  Yes    Pain Location  Hip    Pain Orientation  Left    Pain Descriptors / Indicators  Aching;Sore    Pain Type  Acute pain    Pain Onset  More than a month ago    Pain Frequency  Intermittent    Aggravating Factors   wether , weight bearing    Pain Relieving Factors  rest                      [facility] Adult PT Treatment/Exercise - 05/22/17 0001      Knee/Hip Exercises: Aerobic   Nustep  L5 LE  9 min      Knee/Hip Exercises: Standing   Other Standing Knee Exercises  at wall and reaching with RT and LT leg with cue to contract glut  on LT and lift RT foot for strength and balance.     Other  Standing Knee Exercises  marching with glut sets  and holding  then stand and rotate ER /IR on hp x 15 RT and LT       Knee/Hip Exercises: Supine   Bridges  Both;10 reps    Bridges Limitations  10 reps RT and Lt knee lift with hip lift.       Moist Heat Therapy   Number Minutes Moist Heat  15 Minutes    Moist Heat Location  Hip L               PT Short Term Goals - 05/14/17 1158      PT SHORT TERM GOAL #1   Title  She will be independent with initial  HEP    Status  Achieved        PT Long Term Goals - 05/17/17 1200      PT LONG TERM GOAL #1   Title  She will be independent with all HEP issued    Status  On-going  PT LONG TERM GOAL #2   Title  She will report improved stability on feet being able to walk without device  100% of time.     Status  On-going      PT LONG TERM GOAL #3   Title  Hip strength improved to min 4/10 to provide stability with wlaking and standing for work    Status  Unable to assess      PT LONG TERM GOAL #4   Title  She will be able to perform all home tasks without assist    Status  On-going      PT LONG TERM GOAL #5   Title  She will be able to access up stairs in home with min effort.     Status  On-going      PT LONG TERM GOAL #6   Title  She will report incident of LT thigh and knee pain decr 50% or more    Status  On-going      PT LONG TERM GOAL #7   Title  FOTO score will improve to < 40% limited to demo improved function    Status  Unable to assess            Plan - 05/22/17 1055    Clinical Impression Statement  Pain limitng today so did less but not uch as she was able to do all activity just slower/more deliberate.  Encouraged her to be positive as she is doing well just not to level she wants to be.     PT Treatment/Interventions  Moist Heat;Cryotherapy;Patient/family education;Therapeutic exercise;Manual techniques;Gait training    PT Next Visit Plan  progress exercise as tolerated, continue heat as helpful,  standing exercises    PT Home Exercise Plan  SLR , bridge , sit to stand , single leg hip hinge,  face wall reaching , stepping rotationally.     Consulted and Agree with Plan of Care  Patient       Patient will benefit from skilled therapeutic intervention in order to improve the following deficits and impairments:  Decreased activity tolerance, Pain, Decreased strength, Difficulty walking  Visit Diagnosis: Left knee pain, unspecified chronicity  Pain in left hip  Difficulty in walking, not elsewhere classified  Muscle weakness (generalized)     Problem List Patient Active Problem List   Diagnosis Date Noted  . Status post total replacement of left hip 02/16/2017  . Pain in left hip 01/15/2017  . Unilateral primary osteoarthritis, left hip 01/15/2017    Caprice RedChasse, Jacqulyn Barresi M   PT 05/22/2017, 11:46 AM  [facility] [address] [place], [place], [address] Phone: [phone]   Fax:  [phone]  Name: Victoria Kaufman MRN: [id number] Date of Birth: Sep 27, 1970

## 2017-05-24 ENCOUNTER — Ambulatory Visit: Payer: BLUE CROSS/BLUE SHIELD

## 2017-05-24 DIAGNOSIS — R262 Difficulty in walking, not elsewhere classified: Secondary | ICD-10-CM

## 2017-05-24 DIAGNOSIS — M25562 Pain in left knee: Secondary | ICD-10-CM

## 2017-05-24 DIAGNOSIS — M6281 Muscle weakness (generalized): Secondary | ICD-10-CM

## 2017-05-24 DIAGNOSIS — M25552 Pain in left hip: Secondary | ICD-10-CM | POA: Diagnosis not present

## 2017-05-24 NOTE — Therapy (Signed)
[facility] [address] [place], [place], [address] Phone: [phone]   Fax:  [phone]  Physical Therapy Treatment  Patient Details  Name: Victoria AddisonLinda Simer MRN: [id number] Date of Birth: 1970/12/27 Referring Provider: Doneen Poissonhristopher Blackman, MD   Encounter Date: 05/24/2017  PT End of Session - 05/24/17 1031    Visit Number  8    Number of Visits  12    Date for PT Re-Evaluation  06/08/17    Authorization Type  BCBS    PT Start Time  1030    PT Stop Time  1100    PT Time Calculation (min)  30 min    Activity Tolerance  Patient tolerated treatment well    Behavior During Therapy  [facility] for tasks assessed/performed       Past Medical History:  Diagnosis Date  . Arthritis   . Headache    hx of migraines 1 or 2 in life  . History of pneumonia   . Pneumonia     Past Surgical History:  Procedure Laterality Date  . CESAREAN SECTION    . CHOLECYSTECTOMY    . TOTAL HIP ARTHROPLASTY Left 02/16/2017   Procedure: LEFT TOTAL HIP ARTHROPLASTY ANTERIOR APPROACH;  Surgeon: Kathryne HitchBlackman, Christopher Y, MD;  Location: [facility];  Service: Orthopedics;  Laterality: Left;    There were no vitals filed for this visit.  Subjective Assessment - 05/24/17 1053    Subjective  Sore from rainy weather. Know what adults were talking about when I was young.    Currently in Pain?  No/denies                      [facility] - 05/24/17 0001      Knee/Hip Exercises: Aerobic   Nustep  L5 LE  6  min      Knee/Hip Exercises: Standing   Lateral Step Up  Left;Step Height: 8";Hand Hold: 1;10 reps    Other Standing Knee Exercises  at wall and reaching with RT and LT leg with cue to contract glut  on LT and lift RT foot for strength and balance.  then leg flexed against door frame and push into fram 3-5 sec x 10 RT/Lt      Knee/Hip Exercises: Supine   Bridges with Ball Squeeze  Both;20 reps    Straight Leg Raises   Right;Left;15 reps    Straight Leg Raises Limitations  cued for abdominal set prior to lift      Knee/Hip Exercises: Prone   Straight Leg Raises  Right;Left;15 reps             PT Education - 05/24/17 1057    Education provided  Yes    Education Details  asked her to rest tomorrow IE noexer then exercise 2 days  no exercise and she will be back to see if she will have less pain    Person(s) Educated  Patient    Methods  Explanation    Comprehension  Verbalized understanding       PT Short Term Goals - 05/14/17 1158      PT SHORT TERM GOAL #1   Title  She will be independent with initial  HEP    Status  Achieved        PT Long Term Goals - 05/17/17 1200      PT LONG TERM GOAL #1   Title  She will be independent with all HEP issued  Status  On-going      PT LONG TERM GOAL #2   Title  She will report improved stability on feet being able to walk without device  100% of time.     Status  On-going      PT LONG TERM GOAL #3   Title  Hip strength improved to min 4/10 to provide stability with wlaking and standing for work    Status  Unable to assess      PT LONG TERM GOAL #4   Title  She will be able to perform all home tasks without assist    Status  On-going      PT LONG TERM GOAL #5   Title  She will be able to access up stairs in home with min effort.     Status  On-going      PT LONG TERM GOAL #6   Title  She will report incident of LT thigh and knee pain decr 50% or more    Status  On-going      PT LONG TERM GOAL #7   Title  FOTO score will improve to < 40% limited to demo improved function    Status  Unable to assess            Plan - 05/24/17 1032    Clinical Impression Statement  Pain this AM but took some pain meds and now no pain . did well with exercises.       PT Treatment/Interventions  Moist Heat;Cryotherapy;Patient/family education;Therapeutic exercise;Manual techniques;Gait training    PT Next Visit Plan  progress exercise as  tolerated, continue heat as helpful, standing exercises    PT Home Exercise Plan  SLR , bridge , sit to stand , single leg hip hinge,  face wall reaching , stepping rotationally.     Consulted and Agree with Plan of Care  Patient       Patient will benefit from skilled therapeutic intervention in order to improve the following deficits and impairments:  Decreased activity tolerance, Pain, Decreased strength, Difficulty walking  Visit Diagnosis: Left knee pain, unspecified chronicity  Pain in left hip  Difficulty in walking, not elsewhere classified  Muscle weakness (generalized)     Problem List Patient Active Problem List   Diagnosis Date Noted  . Status post total replacement of left hip 02/16/2017  . Pain in left hip 01/15/2017  . Unilateral primary osteoarthritis, left hip 01/15/2017    Caprice RedChasse, Avaleen Brownley M  PT 05/24/2017, 10:58 AM  [facility] [address] [place], [place], [address] Phone: [phone]   Fax:  [phone]  Name: Victoria AddisonLinda Kaufman MRN: [id number] Date of Birth: 1970/10/30

## 2017-05-29 ENCOUNTER — Ambulatory Visit: Payer: BLUE CROSS/BLUE SHIELD

## 2017-06-05 ENCOUNTER — Ambulatory Visit: Payer: BLUE CROSS/BLUE SHIELD

## 2017-06-05 DIAGNOSIS — R262 Difficulty in walking, not elsewhere classified: Secondary | ICD-10-CM

## 2017-06-05 DIAGNOSIS — M25552 Pain in left hip: Secondary | ICD-10-CM

## 2017-06-05 DIAGNOSIS — M25562 Pain in left knee: Secondary | ICD-10-CM

## 2017-06-05 DIAGNOSIS — M6281 Muscle weakness (generalized): Secondary | ICD-10-CM

## 2017-06-05 NOTE — Therapy (Signed)
[facility] [address] [place], [place], [address] Phone: [phone]   Fax:  [phone]  Physical Therapy Treatment  Patient Details  Name: Victoria Kaufman MRN: [id number] Date of Birth: 15-Feb-1971 Referring Provider: Doneen Poissonhristopher Blackman, MD   Encounter Date: 06/05/2017  PT End of Session - 06/05/17 1101    Visit Number  9    Number of Visits  12    Date for PT Re-Evaluation  06/29/17    Authorization Type  BCBS    PT Start Time  1100    PT Stop Time  1145    PT Time Calculation (min)  45 min    Activity Tolerance  Patient tolerated treatment well;No increased pain    Behavior During Therapy  WFL for tasks assessed/performed       Past Medical History:  Diagnosis Date  . Arthritis   . Headache    hx of migraines 1 or 2 in life  . History of pneumonia   . Pneumonia     Past Surgical History:  Procedure Laterality Date  . CESAREAN SECTION    . CHOLECYSTECTOMY    . TOTAL HIP ARTHROPLASTY Left 02/16/2017   Procedure: LEFT TOTAL HIP ARTHROPLASTY ANTERIOR APPROACH;  Surgeon: Kathryne HitchBlackman, Christopher Y, MD;  Location: [facility];  Service: Orthopedics;  Laterality: Left;    There were no vitals filed for this visit.  Subjective Assessment - 06/05/17 1209    Subjective  No pain , took meds earlier.   Feel can get out of chair easier    Currently in Pain?  No/denies                      [facility] Treatment/Exercise - 06/05/17 0001      Knee/Hip Exercises: Aerobic   Nustep  L5 LE  6  min      Knee/Hip Exercises: Standing   Other Standing Knee Exercises  at wall and reaching with RT and LT leg with cue to contract glut  on LT and lift RT foot for strength and balance.  then leg flexed against door frame and push into fram 3-5 sec x 10 RT/Lt then reaching ovehead 3 pounds  with knee lifts  same and opposit knees.    Other Standing Knee Exercises  stairs with cue to pull glutes in 20 steps with rail,   then exagerated arm and knee lifts walking  then exagerated trunk rotation with walking all for balance /strength and ROM .   then sit to stand with both legs and with LT leg back RT forward. x8-10 each                PT Short Term Goals - 05/14/17 1158      PT SHORT TERM GOAL #1   Title  She will be independent with initial  HEP    Status  Achieved        PT Long Term Goals - 06/05/17 1211      PT LONG TERM GOAL #1   Title  She will be independent with all HEP issued    Status  On-going      PT LONG TERM GOAL #2   Title  She will report improved stability on feet being able to walk without device  100% of time.     Status  Achieved      PT LONG TERM GOAL #3   Title  Hip strength improved to min 4/10 to  provide stability with wlaking and standing for work    Status  Unable to assess      PT LONG TERM GOAL #4   Title  She will be able to perform all home tasks without assist    Status  Achieved      PT LONG TERM GOAL #5   Title  She will be able to access up stairs in home with min effort.     Status  Achieved      PT LONG TERM GOAL #6   Title  She will report incident of LT thigh and knee pain decr 50% or more    Status  Achieved      PT LONG TERM GOAL #7   Title  FOTO score will improve to < 40% limited to demo improved function    Status  Unable to assess            Plan - 06/05/17 1102    Clinical Impression Statement  She reports improvement and is able to tolerate more strenuos activity on feet. She still had lean to Lt and decr rot with walking.  This improved post session.    PT Frequency  2x / week    PT Duration  4 weeks    PT Treatment/Interventions  Moist Heat;Cryotherapy;Patient/family education;Therapeutic exercise;Manual techniques;Gait training    PT Next Visit Plan  progress exercise as tolerated, continue heat as helpful, standing exercises              FOTO NEXT VISIT    PT Home Exercise Plan  SLR , bridge , sit to stand , single leg  hip hinge,  face wall reaching , stepping rotationally.     Consulted and Agree with Plan of Care  Patient       Patient will benefit from skilled therapeutic intervention in order to improve the following deficits and impairments:  Decreased activity tolerance, Pain, Decreased strength, Difficulty walking  Visit Diagnosis: Left knee pain, unspecified chronicity  Pain in left hip  Difficulty in walking, not elsewhere classified  Muscle weakness (generalized)     Problem List Patient Active Problem List   Diagnosis Date Noted  . Status post total replacement of left hip 02/16/2017  . Pain in left hip 01/15/2017  . Unilateral primary osteoarthritis, left hip 01/15/2017    Caprice RedChasse, Juanette Urizar M PT 06/05/2017, 12:14 PM  [facility] [address] [place], [place], [address] Phone: [phone]   Fax:  [phone]  Name: Victoria Kaufman MRN: [id number] Date of Birth: Feb 27, 1971

## 2017-06-07 ENCOUNTER — Ambulatory Visit: Payer: BLUE CROSS/BLUE SHIELD

## 2017-06-07 DIAGNOSIS — M25562 Pain in left knee: Secondary | ICD-10-CM

## 2017-06-07 DIAGNOSIS — M25552 Pain in left hip: Secondary | ICD-10-CM | POA: Diagnosis not present

## 2017-06-07 DIAGNOSIS — M6281 Muscle weakness (generalized): Secondary | ICD-10-CM

## 2017-06-07 DIAGNOSIS — R262 Difficulty in walking, not elsewhere classified: Secondary | ICD-10-CM

## 2017-06-07 NOTE — Patient Instructions (Signed)
Groin Stretch    Hands on hips, back straight, feet parallel and 4"-6" greater than shoulder width apart. Slowly shift weight to one side, keeping bent knee directly over foot. Hold __10-30 secHIP: Flexors - Supine    Lie on edge of surface. Place leg off the surface, allow knee to bend. Bring other knee toward chest. Hold _10-30__ seconds. ___1-3 reps per set, __1-3_ sets per day, ___7 days per week Rest lowered foot on stool.  Copyright  VHI. All rights reserved.  __ seconds. Repeat to other side.  Copyright  VHI. All rights reserved.

## 2017-06-07 NOTE — Therapy (Signed)
[facility] [address] [place], [place], [address] Phone: [phone]   Fax:  [phone]  Physical Therapy Treatment  Patient Details  Name: Victoria AddisonLinda Kaufman MRN: [id number] Date of Birth: Jun 15, 1971 Referring Provider: Doneen Poissonhristopher Blackman, MD   Encounter Date: 06/07/2017  PT End of Session - 06/07/17 1425    Visit Number  10    Number of Visits  17    Date for PT Re-Evaluation  06/29/17    Authorization Type  BCBS    PT Start Time  0218    PT Stop Time  0300    PT Time Calculation (min)  42 min    Activity Tolerance  Patient limited by lethargy    Behavior During Therapy  [facility] for tasks assessed/performed       Past Medical History:  Diagnosis Date  . Arthritis   . Headache    hx of migraines 1 or 2 in life  . History of pneumonia   . Pneumonia     Past Surgical History:  Procedure Laterality Date  . CESAREAN SECTION    . CHOLECYSTECTOMY    . TOTAL HIP ARTHROPLASTY Left 02/16/2017   Procedure: LEFT TOTAL HIP ARTHROPLASTY ANTERIOR APPROACH;  Surgeon: Kathryne HitchBlackman, Christopher Y, MD;  Location: [facility];  Service: Orthopedics;  Laterality: Left;    There were no vitals filed for this visit.      [facility] Assessment - 06/07/17 0001      Observation/Other Assessments   Focus on Therapeutic Outcomes (FOTO)   40% limited      Strength   Left Hip Flexion  4-/5 verbal encouragement needed    Left Hip Extension  3+/5    Left Hip External Rotation  4+/5    Left Hip Internal Rotation  4/5    Left Hip ABduction  4/5 verbal encoragement needed    Left Hip ADduction  4/5                  [facility] Adult PT Treatment/Exercise - 06/07/17 0001      Knee/Hip Exercises: Stretches   Hip Flexor Stretch  Left;2 reps;20 seconds    Other Knee/Hip Stretches  hip adductor stretching in standing 20 sec x 2      Knee/Hip Exercises: Aerobic   Nustep  L6 LE  6  min      Knee/Hip Exercises: Supine   Bridges  Both;10 reps     Bridges with Clamshell  15 reps;Both blue band    Straight Leg Raises  Left;15 reps      Knee/Hip Exercises: Sidelying   Hip ABduction  Left;15 reps    Clams   x15 LT       Knee/Hip Exercises: Prone   Straight Leg Raises  Right;Left 12 rep               PT Short Term Goals - 05/14/17 1158      PT SHORT TERM GOAL #1   Title  She will be independent with initial  HEP    Status  Achieved        PT Long Term Goals - 06/05/17 1211      PT LONG TERM GOAL #1   Title  She will be independent with all HEP issued    Status  On-going      PT LONG TERM GOAL #2   Title  She will report improved stability on feet being able to walk without device  100% of time.  Status  Achieved      PT LONG TERM GOAL #3   Title  Hip strength improved to min 4/10 to provide stability with wlaking and standing for work    Status  Unable to assess      PT LONG TERM GOAL #4   Title  She will be able to perform all home tasks without assist    Status  Achieved      PT LONG TERM GOAL #5   Title  She will be able to access up stairs in home with min effort.     Status  Achieved      PT LONG TERM GOAL #6   Title  She will report incident of LT thigh and knee pain decr 50% or more    Status  Achieved      PT LONG TERM GOAL #7   Title  FOTO score will improve to < 40% limited to demo improved function    Status  Unable to assess            Plan - 06/07/17 1431    Clinical Impression Statement  She demo increased hip strength though still weak. She reports she will not return to work until 08/1017.   Continues to use medication to eas pain . Tired today so did mat exercises    PT Frequency  2x / week    PT Treatment/Interventions  Moist Heat;Cryotherapy;Patient/family education;Therapeutic exercise;Manual techniques;Gait training    PT Next Visit Plan  progress exercise as tolerated, continue heat as helpful, standing exercises              FOTO NEXT VISIT    PT Home Exercise Plan   SLR , bridge , sit to stand , single leg hip hinge,  face wall reaching , stepping rotationally.     Consulted and Agree with Plan of Care  Patient       Patient will benefit from skilled therapeutic intervention in order to improve the following deficits and impairments:  Decreased activity tolerance, Pain, Decreased strength, Difficulty walking  Visit Diagnosis: Left knee pain, unspecified chronicity  Pain in left hip  Difficulty in walking, not elsewhere classified  Muscle weakness (generalized)     Problem List Patient Active Problem List   Diagnosis Date Noted  . Status post total replacement of left hip 02/16/2017  . Pain in left hip 01/15/2017  . Unilateral primary osteoarthritis, left hip 01/15/2017    Caprice RedChasse, Raeford Brandenburg M  PT 06/07/2017, 3:14 PM  [facility] [address] [place], [place], [address] Phone: [phone]   Fax:  [phone]  Name: Victoria AddisonLinda Dougan MRN: [id number] Date of Birth: 11/07/1970

## 2017-06-12 ENCOUNTER — Ambulatory Visit: Payer: BLUE CROSS/BLUE SHIELD | Attending: Orthopaedic Surgery

## 2017-06-12 DIAGNOSIS — R262 Difficulty in walking, not elsewhere classified: Secondary | ICD-10-CM | POA: Diagnosis present

## 2017-06-12 DIAGNOSIS — M25552 Pain in left hip: Secondary | ICD-10-CM | POA: Insufficient documentation

## 2017-06-12 DIAGNOSIS — M25562 Pain in left knee: Secondary | ICD-10-CM | POA: Diagnosis present

## 2017-06-12 DIAGNOSIS — M6281 Muscle weakness (generalized): Secondary | ICD-10-CM | POA: Insufficient documentation

## 2017-06-12 NOTE — Therapy (Signed)
[facility] [address] [place], [place], [address] Phone: [phone]   Fax:  [phone]  Physical Therapy Treatment  Patient Details  Name: Victoria AddisonLinda Findlay MRN: [id number] Date of Birth: January 12, 1971 Referring Provider: Doneen Poissonhristopher Blackman, MD   Encounter Date: 06/12/2017  PT End of Session - 06/12/17 0846    Visit Number  11    Number of Visits  17    Date for PT Re-Evaluation  06/29/17    Authorization Type  BCBS    PT Start Time  0845    PT Stop Time  0935    PT Time Calculation (min)  50 min    Activity Tolerance  Patient limited by lethargy    Behavior During Therapy  [facility] for tasks assessed/performed       Past Medical History:  Diagnosis Date  . Arthritis   . Headache    hx of migraines 1 or 2 in life  . History of pneumonia   . Pneumonia     Past Surgical History:  Procedure Laterality Date  . CESAREAN SECTION    . CHOLECYSTECTOMY    . TOTAL HIP ARTHROPLASTY Left 02/16/2017   Procedure: LEFT TOTAL HIP ARTHROPLASTY ANTERIOR APPROACH;  Surgeon: Kathryne HitchBlackman, Christopher Y, MD;  Location: [facility];  Service: Orthopedics;  Laterality: Left;    There were no vitals filed for this visit.  Subjective Assessment - 06/12/17 0847    Subjective  no pain more stiffness. Rode bike for 20 min outside.     Currently in Pain?  No/denies                      [facility] Treatment/Exercise - 06/12/17 0001      Knee/Hip Exercises: Stretches   Other Knee/Hip Stretches  hip adductor stretching in standing 30 sec x 1 then 20 sec x1      Knee/Hip Exercises: Aerobic   Nustep  L6 LE  6  min      Knee/Hip Exercises: Standing   Hip Abduction  Left;2 sets;10 reps    Abduction Limitations  red band    Lateral Step Up  Left;Step Height: 8";Hand Hold: 1;10 reps    Forward Step Up  Left;20 reps;Hand Hold: 1;Step Height: 8"    Wall Squat  10 reps;10 seconds    Other Standing Knee Exercises  side step with left   FOOT ON BLUE FOAM   x15      Moist Heat Therapy   Number Minutes Moist Heat  12 Minutes    Moist Heat Location  -- lateral thigh and quads      Manual Therapy   Soft tissue mobilization  with roller to lateral thigh and quads               PT Short Term Goals - 05/14/17 1158      PT SHORT TERM GOAL #1   Title  She will be independent with initial  HEP    Status  Achieved        PT Long Term Goals - 06/05/17 1211      PT LONG TERM GOAL #1   Title  She will be independent with all HEP issued    Status  On-going      PT LONG TERM GOAL #2   Title  She will report improved stability on feet being able to walk without device  100% of time.     Status  Achieved  PT LONG TERM GOAL #3   Title  Hip strength improved to min 4/10 to provide stability with wlaking and standing for work    Status  Unable to assess      PT LONG TERM GOAL #4   Title  She will be able to perform all home tasks without assist    Status  Achieved      PT LONG TERM GOAL #5   Title  She will be able to access up stairs in home with min effort.     Status  Achieved      PT LONG TERM GOAL #6   Title  She will report incident of LT thigh and knee pain decr 50% or more    Status  Achieved      PT LONG TERM GOAL #7   Title  FOTO score will improve to < 40% limited to demo improved function    Status  Unable to assess            Plan - 06/12/17 0847    Clinical Impression Statement  Soreness with exercise so did STW and heat to end She did not stop due to soreness.   She started riding bike at home which is a good sign  she is stronger    PT Treatment/Interventions  Moist Heat;Cryotherapy;Patient/family education;Therapeutic exercise;Manual techniques;Gait training    PT Next Visit Plan  progress exercise as tolerated, continue heat as helpful, standing exercises                  PT Home Exercise Plan  SLR , bridge , sit to stand , single leg hip hinge,  face wall reaching , stepping  rotationally.     Consulted and Agree with Plan of Care  Patient       Patient will benefit from skilled therapeutic intervention in order to improve the following deficits and impairments:  Decreased activity tolerance, Pain, Decreased strength, Difficulty walking  Visit Diagnosis: Left knee pain, unspecified chronicity  Pain in left hip  Difficulty in walking, not elsewhere classified  Muscle weakness (generalized)     Problem List Patient Active Problem List   Diagnosis Date Noted  . Status post total replacement of left hip 02/16/2017  . Pain in left hip 01/15/2017  . Unilateral primary osteoarthritis, left hip 01/15/2017    Caprice RedChasse, Queenie Aufiero M  PT 06/12/2017, 9:29 AM  [facility] [address] [place], [place], [address] Phone: [phone]   Fax:  [phone]  Name: Victoria AddisonLinda Carapia MRN: [id number] Date of Birth: 12/15/1970

## 2017-06-14 ENCOUNTER — Ambulatory Visit: Payer: BLUE CROSS/BLUE SHIELD

## 2017-06-14 DIAGNOSIS — M25562 Pain in left knee: Secondary | ICD-10-CM | POA: Diagnosis not present

## 2017-06-14 DIAGNOSIS — M6281 Muscle weakness (generalized): Secondary | ICD-10-CM

## 2017-06-14 DIAGNOSIS — R262 Difficulty in walking, not elsewhere classified: Secondary | ICD-10-CM

## 2017-06-14 DIAGNOSIS — M25552 Pain in left hip: Secondary | ICD-10-CM

## 2017-06-14 NOTE — Therapy (Signed)
[place], [place], [address] Phone: [phone]   Fax:  [phone]  Physical Therapy Treatment  Patient Details  Name: Victoria Kaufman MRN: [id number] Date of Birth: January 22, 1971 Referring Provider: Jean Rosenthal, MD   Encounter Date: 06/14/2017  PT End of Session - 06/14/17 1025    Visit Number  12    Number of Visits  17    Date for PT Re-Evaluation  06/29/17    Authorization Type  BCBS    PT Start Time  1025 pt late    PT Stop Time  1100    PT Time Calculation (min)  35 min    Activity Tolerance  Patient tolerated treatment well    Behavior During Therapy  [facility] for tasks assessed/performed       Past Medical History:  Diagnosis Date  . Arthritis   . Headache    hx of migraines 1 or 2 in life  . History of pneumonia   . Pneumonia     Past Surgical History:  Procedure Laterality Date  . CESAREAN SECTION    . CHOLECYSTECTOMY    . TOTAL HIP ARTHROPLASTY Left 02/16/2017   Procedure: LEFT TOTAL HIP ARTHROPLASTY ANTERIOR APPROACH;  Surgeon: Mcarthur Rossetti, MD;  Location: [facility];  Service: Orthopedics;  Laterality: Left;    There were no vitals filed for this visit.  Subjective Assessment - 06/14/17 1030    Subjective  Stif . very mild sore    Currently in Pain?  Yes    Pain Score  1     Pain Location  Hip    Pain Orientation  Left    Pain Descriptors / Indicators  Aching    Pain Type  -- sub acute    Pain Onset  More than a month ago    Pain Frequency  Intermittent    Aggravating Factors   weather , weight bearing activity    Pain Relieving Factors  rest    Multiple Pain Sites  No                      [facility] Adult PT Treatment/Exercise - 06/14/17 0001      Knee/Hip Exercises: Aerobic   Nustep  L6 LE  5  min      Knee/Hip Exercises: Machines for Strengthening   Hip Cybex  25 pounds x 15 extension and abdction LT       Knee/Hip Exercises: Standing    Forward Step Up  Left;20 reps;Step Height: 8";Hand Hold: 1    Other Standing Knee Exercises  side stepping RT and LT x 2 50 feet each and forward high knees and backward  50 feet.  then walking fast x 4 50 feet.                 PT Short Term Goals - 05/14/17 1158      PT SHORT TERM GOAL #1   Title  She will be independent with initial  HEP    Status  Achieved        PT Long Term Goals - 06/14/17 1057      PT LONG TERM GOAL #1   Title  She will be independent with all HEP issued    Status  On-going      PT LONG TERM GOAL #2   Title  She will report improved stability on feet being able to walk without device  100% of time.  Status  Achieved      PT LONG TERM GOAL #3   Title  Hip strength improved to min 4/10 to provide stability with wlaking and standing for work    Status  Unable to assess      PT LONG TERM GOAL #4   Title  She will be able to perform all home tasks without assist    Status  Achieved      PT LONG TERM GOAL #5   Title  She will be able to access up stairs in home with min effort.     Status  Achieved      PT LONG TERM GOAL #6   Title  She will report incident of LT thigh and knee pain decr 50% or more    Status  Achieved      PT LONG TERM GOAL #7   Baseline  40% on 06/07/17    Status  Partially Met            Plan - 06/14/17 1029    Clinical Impression Statement  Better today . Walking well today.   She reports she felt better /worked out post session. continue strengthening and activity on feet for strenght and balance and conditioning Still sore but no real pain today    PT Treatment/Interventions  Moist Heat;Cryotherapy;Patient/family education;Therapeutic exercise;Manual techniques;Gait training    PT Next Visit Plan  progress exercise as tolerated, continue heat as helpful, standing exercises                  PT Home Exercise Plan  SLR , bridge , sit to stand , single leg hip hinge,  face wall reaching , stepping rotationally.      Consulted and Agree with Plan of Care  Patient       Patient will benefit from skilled therapeutic intervention in order to improve the following deficits and impairments:  Decreased activity tolerance, Pain, Decreased strength, Difficulty walking  Visit Diagnosis: Left knee pain, unspecified chronicity  Pain in left hip  Difficulty in walking, not elsewhere classified  Muscle weakness (generalized)     Problem List Patient Active Problem List   Diagnosis Date Noted  . Status post total replacement of left hip 02/16/2017  . Pain in left hip 01/15/2017  . Unilateral primary osteoarthritis, left hip 01/15/2017    Darrel Hoover PT 06/14/2017, 10:59 AM  [facility] [address] [place], [place], [address] Phone: [phone]   Fax:  [phone]  Name: Victoria Kaufman MRN: [id number] Date of Birth: 09-10-1970

## 2017-06-19 ENCOUNTER — Ambulatory Visit: Payer: BLUE CROSS/BLUE SHIELD | Admitting: Physical Therapy

## 2017-06-21 ENCOUNTER — Ambulatory Visit: Payer: BLUE CROSS/BLUE SHIELD

## 2017-06-21 ENCOUNTER — Telehealth: Payer: Self-pay | Admitting: Physical Therapy
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
## 2017-06-26 ENCOUNTER — Ambulatory Visit: Payer: BLUE CROSS/BLUE SHIELD

## 2017-06-26 DIAGNOSIS — M25562 Pain in left knee: Secondary | ICD-10-CM | POA: Diagnosis not present

## 2017-06-26 DIAGNOSIS — M25552 Pain in left hip: Secondary | ICD-10-CM

## 2017-06-26 DIAGNOSIS — R262 Difficulty in walking, not elsewhere classified: Secondary | ICD-10-CM

## 2017-06-26 DIAGNOSIS — M6281 Muscle weakness (generalized): Secondary | ICD-10-CM

## 2017-06-26 NOTE — Therapy (Signed)
[place], [place], [address] Phone: [phone]   Fax:  [phone]  Physical Therapy Treatment  Patient Details  Name: Victoria Kaufman MRN: [id number] Date of Birth: [birth year] Referring Provider: Jean Rosenthal, MD   Encounter Date: 06/26/2017  PT End of Session - 06/26/17 1030    Visit Number  13    Number of Visits  17    Date for PT Re-Evaluation  07/24/17    Authorization Type  BCBS    PT Start Time  1030 late    PT Stop Time  1100    PT Time Calculation (min)  30 min    Activity Tolerance  Patient tolerated treatment well    Behavior During Therapy  [facility] for tasks assessed/performed       Past Medical History:  Diagnosis Date  . Arthritis   . Headache    hx of migraines 1 or 2 in life  . History of pneumonia   . Pneumonia     Past Surgical History:  Procedure Laterality Date  . CESAREAN SECTION    . CHOLECYSTECTOMY    . TOTAL HIP ARTHROPLASTY Left 02/16/2017   Procedure: LEFT TOTAL HIP ARTHROPLASTY ANTERIOR APPROACH;  Surgeon: Mcarthur Rossetti, MD;  Location: [facility];  Service: Orthopedics;  Laterality: Left;    There were no vitals filed for this visit.  Subjective Assessment - 06/26/17 1032    Subjective  No pain now . Leg doing well Expect pain by end of day with christmas shopping    Currently in Pain?  No/denies                      [facility] Adult PT Treatment/Exercise - 06/26/17 0001      Knee/Hip Exercises: Aerobic   Nustep  L6 LE  5  min      Knee/Hip Exercises: Standing   Forward Step Up  Left;20 reps;Step Height: 8";Hand Hold: 1 asked to incr pace to fast but safe level    Wall Squat  20 reps;5 seconds    Other Standing Knee Exercises  side stepping and forward and back 5 plates pully system x 5 reps moving and  10  with LT leg stance and RT leg moving      Knee/Hip Exercises: Seated   Long Arc Quad  Left;25 reps;Weights    Long Arc Quad  Weight  8 lbs.  5 sec               PT Short Term Goals - 05/14/17 1158      PT SHORT TERM GOAL #1   Title  She will be independent with initial  HEP    Status  Achieved        PT Long Term Goals - 06/26/17 1054      PT LONG TERM GOAL #1   Title  She will be independent with all HEP issued    Status  On-going      PT LONG TERM GOAL #2   Title  She will report improved stability on feet being able to walk without device  100% of time.     Status  Achieved      PT LONG TERM GOAL #3   Title  Hip strength improved to min 4/5 to provide stability with walking and standing for work    Baseline  4/5 today    Status  Achieved      PT LONG TERM  GOAL #4   Title  She will be able to perform all home tasks without assist    Status  Achieved      PT LONG TERM GOAL #5   Title  She will be able to access up stairs in home with min effort.     Status  Achieved      PT LONG TERM GOAL #6   Title  She will report incident of LT thigh and knee pain decr 50% or more    Status  Achieved      PT LONG TERM GOAL #7   Title  FOTO score will improve to < 40% limited to demo improved function    Baseline  40% on 06/07/17    Status  Partially Met            Plan - 06/26/17 1059    Clinical Impression Statement  She was able to do all exercxies without complaint of pain . she has been to the gym and is using the machines there. Will assess goals and MMT next visit and decide on extension or discharge    PT Treatment/Interventions  Moist Heat;Cryotherapy;Patient/family education;Therapeutic exercise;Manual techniques;Gait training    PT Next Visit Plan  progress exercise as tolerated, continue heat as helpful, standing exercises     MMT  Goals  ? discharge versus extension             PT Home Exercise Plan  SLR , bridge , sit to stand , single leg hip hinge,  face wall reaching , stepping rotationally.     Consulted and Agree with Plan of Care  Patient       Patient will benefit  from skilled therapeutic intervention in order to improve the following deficits and impairments:  Decreased activity tolerance, Pain, Decreased strength, Difficulty walking  Visit Diagnosis: Left knee pain, unspecified chronicity  Pain in left hip  Difficulty in walking, not elsewhere classified  Muscle weakness (generalized)     Problem List Patient Active Problem List   Diagnosis Date Noted  . Status post total replacement of left hip 02/16/2017  . Pain in left hip 01/15/2017  . Unilateral primary osteoarthritis, left hip 01/15/2017    Darrel Hoover  PT 06/26/2017, 12:43 PM  [facility] [address] [place], [place], [address] Phone: [phone]   Fax:  [phone]  Name: Victoria Kaufman MRN: [id number] Date of Birth: April 08, 1971

## 2017-06-28 ENCOUNTER — Ambulatory Visit: Payer: BLUE CROSS/BLUE SHIELD

## 2017-06-28 DIAGNOSIS — M25552 Pain in left hip: Secondary | ICD-10-CM

## 2017-06-28 DIAGNOSIS — M25562 Pain in left knee: Secondary | ICD-10-CM

## 2017-06-28 DIAGNOSIS — M6281 Muscle weakness (generalized): Secondary | ICD-10-CM

## 2017-06-28 DIAGNOSIS — R262 Difficulty in walking, not elsewhere classified: Secondary | ICD-10-CM

## 2017-06-28 NOTE — Addendum Note (Signed)
Addended by: Caprice RedHASSE, Stepheny Canal M on: 06/28/2017 11:45 AM   Modules accepted: Orders

## 2017-06-28 NOTE — Therapy (Addendum)
[place], [place], [address] Phone: [phone]   Fax:  [phone]  Physical Therapy Treatment  Patient Details  Name: Victoria Kaufman MRN: [id number] Date of Birth: Jun 25, 1971 Referring Provider: Jean Rosenthal, MD   Encounter Date: 06/28/2017  PT End of Session - 06/28/17 1135    Visit Number  14    Number of Visits  17    Date for PT Re-Evaluation  07/27/17    Authorization Type  BCBS    PT Start Time  1030    PT Stop Time  1103    PT Time Calculation (min)  33 min    Activity Tolerance  Patient tolerated treatment well;No increased pain    Behavior During Therapy  WFL for tasks assessed/performed       Past Medical History:  Diagnosis Date  . Arthritis   . Headache    hx of migraines 1 or 2 in life  . History of pneumonia   . Pneumonia     Past Surgical History:  Procedure Laterality Date  . CESAREAN SECTION    . CHOLECYSTECTOMY    . TOTAL HIP ARTHROPLASTY Left 02/16/2017   Procedure: LEFT TOTAL HIP ARTHROPLASTY ANTERIOR APPROACH;  Surgeon: Mcarthur Rossetti, MD;  Location: [facility];  Service: Orthopedics;  Laterality: Left;    There were no vitals filed for this visit.  Subjective Assessment - 06/28/17 1134    Subjective  no pain . still feels weaker and decr endurance. Pain after being on feet for hour or more    Currently in Pain?  No/denies         [facility] PT Assessment - 06/28/17 0001      Observation/Other Assessments   Focus on Therapeutic Outcomes (FOTO)   40% limited      Strength   Left Hip Flexion  4/5    Left Hip Extension  4/5    Left Hip External Rotation  4+/5    Left Hip Internal Rotation  4/5    Left Hip ABduction  4/5    Left Hip ADduction  4/5                  [facility] Adult PT Treatment/Exercise - 06/28/17 0001      Knee/Hip Exercises: Aerobic   Nustep  L7 LE  5  min      Knee/Hip Exercises: Standing   Lateral Step Up  Left;2  sets;10 reps;Step Height: 8"    Forward Step Up  Left;Step Height: 8";Hand Hold: 2 quickly as comfortable 25 reps    Lunge Walking - Round Trips  x20 RT and LT     Other Standing Knee Exercises  side stepping with rotation to left foot 10 inch platform for rotational strength /stability forward and back 5 plates pully system x 5 reps moving and  10  with LT leg stance and RT leg moving  (Pended)                PT Short Term Goals - 05/14/17 1158      PT SHORT TERM GOAL #1   Title  She will be independent with initial  HEP    Status  Achieved        PT Long Term Goals - 06/28/17 1138      PT LONG TERM GOAL #1   Title  She will be independent with all HEP issued    Status  On-going  PT LONG TERM GOAL #2   Title  She will report improved stability on feet being able to walk without device  100% of time.     Status  Achieved      PT LONG TERM GOAL #3   Title  Hip strength improved to min 4/5 to provide stability with walking and standing for work    Baseline  4/5 today    Status  Achieved      PT LONG TERM GOAL #4   Title  She will be able to perform all home tasks without assist    Status  Achieved      PT LONG TERM GOAL #5   Title  She will be able to access up stairs in home with min effort.     Status  Achieved      Additional Long Term Goals   Additional Long Term Goals  Yes      PT LONG TERM GOAL #6   Title  She will report incident of LT thigh and knee pain decr 50% or more    Status  Achieved      PT LONG TERM GOAL #7   Title  FOTO score will improve to < 40% limited to demo improved function    Baseline  40% on 06/07/17    Status  Partially Met      PT LONG TERM GOAL #8   Title  Strength LT hip 4+/5 for endurance and improved ability to RTW    Time  4    Period  Weeks    Status  New            Plan - 06/28/17 1106    Clinical Impression Statement  Again tolerating more intense exercises. Still decreased endurance and weakness but no  pain during session.     PT Frequency  2x / week    PT Duration  3 weeks    PT Treatment/Interventions  Moist Heat;Cryotherapy;Patient/family education;Therapeutic exercise;Manual techniques;Gait training    PT Next Visit Plan  progress exercise as tolerated, continue heat as helpful, standing exercises     MMT  Goals   extension             PT Home Exercise Plan  SLR , bridge , sit to stand , single leg hip hinge,  face wall reaching , stepping rotationally.     Consulted and Agree with Plan of Care  Patient       Patient will benefit from skilled therapeutic intervention in order to improve the following deficits and impairments:  Decreased activity tolerance, Pain, Decreased strength, Difficulty walking  Visit Diagnosis: Muscle weakness (generalized)  Difficulty in walking, not elsewhere classified  Pain in left hip  Left knee pain, unspecified chronicity     Problem List Patient Active Problem List   Diagnosis Date Noted  . Status post total replacement of left hip 02/16/2017  . Pain in left hip 01/15/2017  . Unilateral primary osteoarthritis, left hip 01/15/2017    Darrel Hoover  PT 06/28/2017, 11:43 AM  [facility] [address] [place], [place], [address] Phone: [phone]   Fax:  [phone]  Name: Victoria Kaufman MRN: [id number] Date of Birth: 1970-08-19

## 2017-07-12 ENCOUNTER — Ambulatory Visit: Payer: BLUE CROSS/BLUE SHIELD | Attending: Orthopaedic Surgery

## 2017-07-12 DIAGNOSIS — R262 Difficulty in walking, not elsewhere classified: Secondary | ICD-10-CM | POA: Diagnosis present

## 2017-07-12 DIAGNOSIS — M25562 Pain in left knee: Secondary | ICD-10-CM | POA: Diagnosis present

## 2017-07-12 DIAGNOSIS — M25552 Pain in left hip: Secondary | ICD-10-CM | POA: Insufficient documentation

## 2017-07-12 DIAGNOSIS — M6281 Muscle weakness (generalized): Secondary | ICD-10-CM

## 2017-07-12 NOTE — Therapy (Signed)
[place], [place], [address] Phone: [phone]   Fax:  [phone]  Physical Therapy Treatment  Patient Details  Name: Victoria Kaufman MRN: [id number] Date of Birth: 1970/08/18 Referring Provider: Jean Rosenthal, MD   Encounter Date: 07/12/2017  PT End of Session - 07/12/17 1113    Visit Number  15    Number of Visits  17    Date for PT Re-Evaluation  07/27/17    Authorization Type  BCBS    PT Start Time  1110 late    PT Stop Time  1145    PT Time Calculation (min)  35 min    Activity Tolerance  Patient tolerated treatment well;No increased pain    Behavior During Therapy  WFL for tasks assessed/performed       Past Medical History:  Diagnosis Date  . Arthritis   . Headache    hx of migraines 1 or 2 in life  . History of pneumonia   . Pneumonia     Past Surgical History:  Procedure Laterality Date  . CESAREAN SECTION    . CHOLECYSTECTOMY    . TOTAL HIP ARTHROPLASTY Left 02/16/2017   Procedure: LEFT TOTAL HIP ARTHROPLASTY ANTERIOR APPROACH;  Surgeon: Mcarthur Rossetti, MD;  Location: [facility];  Service: Orthopedics;  Laterality: Left;    There were no vitals filed for this visit.  Subjective Assessment - 07/12/17 1115    Subjective  Sore all over due to lack of sleep and taking care of grandkids past 2 days.     Pain Score  1     Pain Location  Generalized    Pain Onset  Today    Pain Frequency  Intermittent    Aggravating Factors   weather , poor sleep , activity with grand kids    Multiple Pain Sites  No                      [facility] Adult PT Treatment/Exercise - 07/12/17 0001      Knee/Hip Exercises: Aerobic   Nustep  L6 LE  5  min      Knee/Hip Exercises: Standing   Lateral Step Up  Left;2 sets;Hand Hold: 1;Step Height: 8" second set 10 inches    Forward Step Up  Left;Step Height: 8";Hand Hold: 2;15 reps second set 10 inch platform.     Other Standing Knee  Exercises  side stepping RT and left in bars x 4 each way length of bar      Knee/Hip Exercises: Seated   Other Seated Knee/Hip Exercises  IR RT/Lt hip blue band x 15 then hip flexion standing x 15. , then extension              PT Education - 07/12/17 1140    Education provided  Yes    Education Details  issued blue band for HEP hip flexion /abduction /extension  rotation and sidesteps 10-15 reps 1x/day RT and LT     Person(s) Educated  Patient    Methods  Explanation;Tactile cues;Verbal cues    Comprehension  Returned demonstration;Verbalized understanding       PT Short Term Goals - 05/14/17 1158      PT SHORT TERM GOAL #1   Title  She will be independent with initial  HEP    Status  Achieved        PT Long Term Goals - 06/28/17 1138      PT LONG  TERM GOAL #1   Title  She will be independent with all HEP issued    Status  On-going      PT LONG TERM GOAL #2   Title  She will report improved stability on feet being able to walk without device  100% of time.     Status  Achieved      PT LONG TERM GOAL #3   Title  Hip strength improved to min 4/5 to provide stability with walking and standing for work    Baseline  4/5 today    Status  Achieved      PT LONG TERM GOAL #4   Title  She will be able to perform all home tasks without assist    Status  Achieved      PT LONG TERM GOAL #5   Title  She will be able to access up stairs in home with min effort.     Status  Achieved      Additional Long Term Goals   Additional Long Term Goals  Yes      PT LONG TERM GOAL #6   Title  She will report incident of LT thigh and knee pain decr 50% or more    Status  Achieved      PT LONG TERM GOAL #7   Title  FOTO score will improve to < 40% limited to demo improved function    Baseline  40% on 06/07/17    Status  Partially Met      PT LONG TERM GOAL #8   Title  Strength LT hip 4+/5 for endurance and improved ability to RTW    Time  4    Period  Weeks    Status  New             Plan - 07/12/17 1114    Clinical Impression Statement  Continues with intermitant soreness of Lt hip.  Will continue to work on strength for RTW end of month and complete POC then discharge    PT Treatment/Interventions  Moist Heat;Cryotherapy;Patient/family education;Therapeutic exercise;Manual techniques;Gait training    PT Next Visit Plan  progress exercise as tolerated, continue heat as helpful, standing exercises     MMT  Goals   extension             PT Home Exercise Plan  SLR , bridge , sit to stand , single leg hip hinge,  face wall reaching , stepping rotationally.     Consulted and Agree with Plan of Care  Patient       Patient will benefit from skilled therapeutic intervention in order to improve the following deficits and impairments:  Decreased activity tolerance, Pain, Decreased strength, Difficulty walking  Visit Diagnosis: Muscle weakness (generalized)  Difficulty in walking, not elsewhere classified  Left knee pain, unspecified chronicity  Pain in left hip     Problem List Patient Active Problem List   Diagnosis Date Noted  . Status post total replacement of left hip 02/16/2017  . Pain in left hip 01/15/2017  . Unilateral primary osteoarthritis, left hip 01/15/2017    Darrel Hoover  PT 07/12/2017, 11:41 AM  [facility] [address] [place], [place], [address] Phone: [phone]   Fax:  [phone]  Name: Victoria Kaufman MRN: [id number] Date of Birth: Aug 18, 1970

## 2017-07-16 ENCOUNTER — Ambulatory Visit: Payer: BLUE CROSS/BLUE SHIELD

## 2017-07-16 DIAGNOSIS — M6281 Muscle weakness (generalized): Secondary | ICD-10-CM | POA: Diagnosis not present

## 2017-07-16 DIAGNOSIS — M25552 Pain in left hip: Secondary | ICD-10-CM

## 2017-07-16 DIAGNOSIS — R262 Difficulty in walking, not elsewhere classified: Secondary | ICD-10-CM

## 2017-07-16 DIAGNOSIS — M25562 Pain in left knee: Secondary | ICD-10-CM

## 2017-07-16 NOTE — Therapy (Signed)
[place], [place], [address] Phone: [phone]   Fax:  [phone]  Physical Therapy Treatment  Patient Details  Name: Victoria Kaufman MRN: [id number] Date of Birth: 01/07/1971 Referring Provider: Jean Rosenthal, MD   Encounter Date: 07/16/2017  PT End of Session - 07/16/17 1227    Visit Number  16    Number of Visits  20    Date for PT Re-Evaluation  07/27/17    Authorization Type  BCBS    PT Start Time  1115 late    PT Stop Time  1638    PT Time Calculation (min)  30 min    Activity Tolerance  Patient tolerated treatment well;No increased pain    Behavior During Therapy  WFL for tasks assessed/performed       Past Medical History:  Diagnosis Date  . Arthritis   . Headache    hx of migraines 1 or 2 in life  . History of pneumonia   . Pneumonia     Past Surgical History:  Procedure Laterality Date  . CESAREAN SECTION    . CHOLECYSTECTOMY    . TOTAL HIP ARTHROPLASTY Left 02/16/2017   Procedure: LEFT TOTAL HIP ARTHROPLASTY ANTERIOR APPROACH;  Surgeon: Mcarthur Rossetti, MD;  Location: [facility];  Service: Orthopedics;  Laterality: Left;    There were no vitals filed for this visit.                   [facility] Adult PT Treatment/Exercise - 07/16/17 0001      Neuro Re-ed    Neuro Re-ed Details   single leg balance      Knee/Hip Exercises: Aerobic   Nustep  L6 LE  5  min      Knee/Hip Exercises: Standing   Side Lunges  Right;Left;20 reps    Lateral Step Up  Left;20 reps 10 inches    Forward Step Up  Left;20 reps 10 inch step    Other Standing Knee Exercises  Green band backward walking x 10 trips length of bars               PT Short Term Goals - 05/14/17 1158      PT SHORT TERM GOAL #1   Title  She will be independent with initial  HEP    Status  Achieved        PT Long Term Goals - 06/28/17 1138      PT LONG TERM GOAL #1   Title  She will be  independent with all HEP issued    Status  On-going      PT LONG TERM GOAL #2   Title  She will report improved stability on feet being able to walk without device  100% of time.     Status  Achieved      PT LONG TERM GOAL #3   Title  Hip strength improved to min 4/5 to provide stability with walking and standing for work    Baseline  4/5 today    Status  Achieved      PT LONG TERM GOAL #4   Title  She will be able to perform all home tasks without assist    Status  Achieved      PT LONG TERM GOAL #5   Title  She will be able to access up stairs in home with min effort.     Status  Achieved      Additional  Long Term Goals   Additional Long Term Goals  Yes      PT LONG TERM GOAL #6   Title  She will report incident of LT thigh and knee pain decr 50% or more    Status  Achieved      PT LONG TERM GOAL #7   Title  FOTO score will improve to < 40% limited to demo improved function    Baseline  40% on 06/07/17    Status  Partially Met      PT LONG TERM GOAL #8   Title  Strength LT hip 4+/5 for endurance and improved ability to RTW    Time  4    Period  Weeks    Status  New            Plan - 07/16/17 1227    Clinical Impression Statement  No pain today . Worked hard with exercise ands reported some mild fatigue at end but no pain.   She is always late so timeis usually limited    PT Treatment/Interventions  Moist Heat;Cryotherapy;Patient/family education;Therapeutic exercise;Manual techniques;Gait training    PT Next Visit Plan  progress exercise as tolerated, continue heat as helpful, standing exercises     MMT  Goals   extension             PT Home Exercise Plan  SLR , bridge , sit to stand , single leg hip hinge,  face wall reaching , stepping rotationally.     Consulted and Agree with Plan of Care  Patient       Patient will benefit from skilled therapeutic intervention in order to improve the following deficits and impairments:  Decreased activity tolerance, Pain,  Decreased strength, Difficulty walking  Visit Diagnosis: Muscle weakness (generalized)  Difficulty in walking, not elsewhere classified  Left knee pain, unspecified chronicity  Pain in left hip     Problem List Patient Active Problem List   Diagnosis Date Noted  . Status post total replacement of left hip 02/16/2017  . Pain in left hip 01/15/2017  . Unilateral primary osteoarthritis, left hip 01/15/2017    Darrel Hoover  PT 07/16/2017, 12:29 PM  [facility] [address] [place], [place], [address] Phone: [phone]   Fax:  [phone]  Name: Victoria Kaufman MRN: [id number] Date of Birth: 1970/11/30

## 2017-07-19 ENCOUNTER — Ambulatory Visit: Payer: BLUE CROSS/BLUE SHIELD

## 2017-07-23 ENCOUNTER — Ambulatory Visit: Payer: BLUE CROSS/BLUE SHIELD

## 2017-07-23 DIAGNOSIS — R262 Difficulty in walking, not elsewhere classified: Secondary | ICD-10-CM

## 2017-07-23 DIAGNOSIS — M25562 Pain in left knee: Secondary | ICD-10-CM

## 2017-07-23 DIAGNOSIS — M6281 Muscle weakness (generalized): Secondary | ICD-10-CM

## 2017-07-23 DIAGNOSIS — M25552 Pain in left hip: Secondary | ICD-10-CM

## 2017-07-23 NOTE — Therapy (Signed)
[place], [place], [address] Phone: [phone]   Fax:  [phone]  Physical Therapy Treatment  Patient Details  Name: Victoria Kaufman MRN: [id number] Date of Birth: 04-Nov-1970 Referring Provider: Jean Rosenthal, MD   Encounter Date: 07/23/2017  PT End of Session - 07/23/17 1119    Visit Number  17    Number of Visits  20    Date for PT Re-Evaluation  07/27/17    Authorization Type  BCBS    PT Start Time  1115 late 15 min    PT Stop Time  1145    PT Time Calculation (min)  30 min    Activity Tolerance  Patient tolerated treatment well;No increased pain    Behavior During Therapy  WFL for tasks assessed/performed       Past Medical History:  Diagnosis Date  . Arthritis   . Headache    hx of migraines 1 or 2 in life  . History of pneumonia   . Pneumonia     Past Surgical History:  Procedure Laterality Date  . CESAREAN SECTION    . CHOLECYSTECTOMY    . TOTAL HIP ARTHROPLASTY Left 02/16/2017   Procedure: LEFT TOTAL HIP ARTHROPLASTY ANTERIOR APPROACH;  Surgeon: Mcarthur Rossetti, MD;  Location: [facility];  Service: Orthopedics;  Laterality: Left;    There were no vitals filed for this visit.  Subjective Assessment - 07/23/17 1115    Subjective  doing well.  no complaints.     Currently in Pain?  No/denies                      [facility] Adult PT Treatment/Exercise - 07/23/17 0001      Knee/Hip Exercises: Aerobic   Nustep  L6 LE  5  min      Knee/Hip Exercises: Standing   Side Lunges  15 reps;Right;Left    Lunge Walking - Round Trips  8 trips in parallel bars    Other Standing Knee Exercises   blue band side stepping RT and left in bars x 4 each way length of bar    Other Standing Knee Exercises  blue band backward walking x 10 trips length of bars      Knee/Hip Exercises: Seated   Other Seated Knee/Hip Exercises  IR x 20 blue band               PT Short  Term Goals - 05/14/17 1158      PT SHORT TERM GOAL #1   Title  She will be independent with initial  HEP    Status  Achieved        PT Long Term Goals - 06/28/17 1138      PT LONG TERM GOAL #1   Title  She will be independent with all HEP issued    Status  On-going      PT LONG TERM GOAL #2   Title  She will report improved stability on feet being able to walk without device  100% of time.     Status  Achieved      PT LONG TERM GOAL #3   Title  Hip strength improved to min 4/5 to provide stability with walking and standing for work    Baseline  4/5 today    Status  Achieved      PT LONG TERM GOAL #4   Title  She will be able to perform all home tasks  without assist    Status  Achieved      PT LONG TERM GOAL #5   Title  She will be able to access up stairs in home with min effort.     Status  Achieved      Additional Long Term Goals   Additional Long Term Goals  Yes      PT LONG TERM GOAL #6   Title  She will report incident of LT thigh and knee pain decr 50% or more    Status  Achieved      PT LONG TERM GOAL #7   Title  FOTO score will improve to < 40% limited to demo improved function    Baseline  40% on 06/07/17    Status  Partially Met      PT LONG TERM GOAL #8   Title  Strength LT hip 4+/5 for endurance and improved ability to RTW    Time  4    Period  Weeks    Status  New            Plan - 07/23/17 1119    Clinical Impression Statement  No pain . Doing well with exercises. Ready for discharge  next visit    PT Treatment/Interventions  Moist Heat;Cryotherapy;Patient/family education;Therapeutic exercise;Manual techniques;Gait training    PT Next Visit Plan  finish exer and discharge             Goals and FOTO    PT Home Exercise Plan  SLR , bridge , sit to stand , single leg hip hinge,  face wall reaching , stepping rotationally.     Consulted and Agree with Plan of Care  Patient       Patient will benefit from skilled therapeutic intervention in  order to improve the following deficits and impairments:  Decreased activity tolerance, Pain, Decreased strength, Difficulty walking  Visit Diagnosis: Difficulty in walking, not elsewhere classified  Left knee pain, unspecified chronicity  Pain in left hip  Muscle weakness (generalized)     Problem List Patient Active Problem List   Diagnosis Date Noted  . Status post total replacement of left hip 02/16/2017  . Pain in left hip 01/15/2017  . Unilateral primary osteoarthritis, left hip 01/15/2017    Darrel Hoover  PT 07/23/2017, 11:54 AM  [facility] [address] [place], [place], [address] Phone: [phone]   Fax:  [phone]  Name: Victoria Kaufman MRN: [id number] Date of Birth: 1970/08/11

## 2017-07-31 ENCOUNTER — Ambulatory Visit: Payer: BLUE CROSS/BLUE SHIELD

## 2017-07-31 DIAGNOSIS — M6281 Muscle weakness (generalized): Secondary | ICD-10-CM | POA: Diagnosis not present

## 2017-07-31 DIAGNOSIS — R262 Difficulty in walking, not elsewhere classified: Secondary | ICD-10-CM

## 2017-07-31 DIAGNOSIS — M25552 Pain in left hip: Secondary | ICD-10-CM

## 2017-07-31 DIAGNOSIS — M25562 Pain in left knee: Secondary | ICD-10-CM

## 2017-07-31 NOTE — Therapy (Signed)
[place], [place], [address] Phone: [phone]   Fax:  [phone]  Physical Therapy Treatment/Discharge  Patient Details  Name: Victoria Kaufman MRN: [id number] Date of Birth: [birth year] Referring Provider: Jean Rosenthal, MD   Encounter Date: 07/31/2017  PT End of Session - 07/31/17 1124    Visit Number  18    Number of Visits  20    Authorization Type  [employer]    PT Start Time  1120    PT Stop Time  1146    PT Time Calculation (min)  26 min    Activity Tolerance  Patient tolerated treatment well;No increased pain    Behavior During Therapy  WFL for tasks assessed/performed       Past Medical History:  Diagnosis Date  . Arthritis   . Headache    hx of migraines 1 or 2 in life  . History of pneumonia   . Pneumonia     Past Surgical History:  Procedure Laterality Date  . CESAREAN SECTION    . CHOLECYSTECTOMY    . TOTAL HIP ARTHROPLASTY Left 02/16/2017   Procedure: LEFT TOTAL HIP ARTHROPLASTY ANTERIOR APPROACH;  Surgeon: Mcarthur Rossetti, MD;  Location: [facility];  Service: Orthopedics;  Laterality: Left;    There were no vitals filed for this visit.  Subjective Assessment - 07/31/17 1123    Subjective  doing well.  no complaints. Reports wore 2 inch heels for 2 hours without problems. Ready for discharge. Can keep up with grand daughter now.     Currently in Pain?  No/denies         [facility] PT Assessment - 07/31/17 0001      Observation/Other Assessments   Focus on Therapeutic Outcomes (FOTO)   12% limited      Strength   Left Hip Flexion  4+/5    Left Hip Extension  4+/5    Left Hip External Rotation  4+/5    Left Hip Internal Rotation  4+/5    Left Hip ABduction  4+/5    Left Hip ADduction  4+/5                  [facility] Adult PT Treatment/Exercise - 07/31/17 0001      Knee/Hip Exercises: Aerobic   Nustep  L6 LE  5  min    Other Aerobic  Body weight exercises  at home an dgym discussed with patient.              PT Education - 07/31/17 1149    Education provided  Yes    Education Details  discussed progress and progression /maintaining exercise for strength to maintain independent function    Person(s) Educated  Patient    Methods  Explanation    Comprehension  Verbalized understanding       PT Short Term Goals - 05/14/17 1158      PT SHORT TERM GOAL #1   Title  She will be independent with initial  HEP    Status  Achieved        PT Long Term Goals - 07/31/17 1125      PT LONG TERM GOAL #1   Title  She will be independent with all HEP issued    Status  Achieved      PT LONG TERM GOAL #2   Title  She will report improved stability on feet being able to walk without device  100% of time.  Status  Achieved      PT LONG TERM GOAL #3   Title  Hip strength improved to min 4/5 to provide stability with walking and standing for work    Baseline  4+/5 today    Status  Achieved      PT LONG TERM GOAL #4   Title  She will be able to perform all home tasks without assist    Status  Achieved      PT LONG TERM GOAL #5   Title  She will be able to access up stairs in home with min effort.     Status  Achieved      PT LONG TERM GOAL #6   Title  She will report incident of LT thigh and knee pain decr 50% or more    Status  Achieved      PT LONG TERM GOAL #7   Title  FOTO score will improve to < 40% limited to demo improved function    Baseline  12% limited  today    Status  Achieved      PT LONG TERM GOAL #8   Title  Strength LT hip 4+/5 for endurance and improved ability to RTW    Baseline  4+/5 today    Status  Achieved            Plan - 07/31/17 1124    Clinical Impression Statement  She is ready for discharge as she is able to wear heels , has returned to gym and reports no pain generally.  We discussed her HEP  and progression .  She reports independence with all Home activity    PT Next Visit Plan   Discharge today    PT Home Exercise Plan  SLR , bridge , sit to stand , single leg hip hinge,  face wall reaching , stepping rotationally.     Consulted and Agree with Plan of Care  Patient       Patient will benefit from skilled therapeutic intervention in order to improve the following deficits and impairments:  Decreased activity tolerance, Pain, Decreased strength, Difficulty walking  Visit Diagnosis: Difficulty in walking, not elsewhere classified  Left knee pain, unspecified chronicity  Pain in left hip  Muscle weakness (generalized)     Problem List Patient Active Problem List   Diagnosis Date Noted  . Status post total replacement of left hip 02/16/2017  . Pain in left hip 01/15/2017  . Unilateral primary osteoarthritis, left hip 01/15/2017    Darrel Hoover  PT 07/31/2017, 11:52 AM  [facility] [address] [place], [place], [address] Phone: [phone]   Fax:  [phone]  Name: Victoria Kaufman MRN: [id number] Date of Birth: 1971-06-26  PHYSICAL THERAPY DISCHARGE SUMMARY  Visits from Start of Care: 18  Current functional level related to goals / functional outcomes: See above . She is independent    Remaining deficits: Mild weakness of LT hip   Education / Equipment: HEP  Plan: Patient agrees to discharge.  Patient goals were met. Patient is being discharged due to meeting the stated rehab goals.  ?????

## 2017-08-14 ENCOUNTER — Ambulatory Visit (INDEPENDENT_AMBULATORY_CARE_PROVIDER_SITE_OTHER): Payer: BLUE CROSS/BLUE SHIELD

## 2017-08-14 ENCOUNTER — Ambulatory Visit (INDEPENDENT_AMBULATORY_CARE_PROVIDER_SITE_OTHER): Payer: BLUE CROSS/BLUE SHIELD | Admitting: Orthopaedic Surgery

## 2017-08-14 ENCOUNTER — Encounter (INDEPENDENT_AMBULATORY_CARE_PROVIDER_SITE_OTHER): Payer: Self-pay | Admitting: Orthopaedic Surgery

## 2017-08-14 DIAGNOSIS — Z96642 Presence of left artificial hip joint: Secondary | ICD-10-CM | POA: Diagnosis not present

## 2017-08-14 NOTE — Progress Notes (Signed)
Patient is a very pleasant 47 year old female who is now 6 months status post a left total hip arthroplasty through direct anterior approach.  She is someone who is morbidly obese with a high body mass index but we felt comfortable doing her surgery.  The severity of arthritis was quite significant with complete loss of joint space and cystic changes in the acetabulum and the femoral head.  She said her life is changed better overall.  She is back to full work duties without restrictions.  She has no pain at all she states in that left operative hip.  She did not have any wound complications postoperative either.  On examination when she walks she walks with a limp.  She is significantly morbidly obese.  Her incisions healed nicely and there is no numbness around it.  X-rays of the pelvis and left operative hip show no complicating features with the implants themselves.  Her right hip appears normal.  At this point we will see her back for 1 more visit in 6 months.  I would like an AP and lateral of the left hip only that visit.  We do not need to see the pelvis.

## 2017-08-21 ENCOUNTER — Ambulatory Visit (INDEPENDENT_AMBULATORY_CARE_PROVIDER_SITE_OTHER): Payer: BLUE CROSS/BLUE SHIELD | Admitting: Orthopaedic Surgery

## 2018-02-27 IMAGING — RF DG HIP (WITH PELVIS) OPERATIVE*L*
1 series · 2 of 2 positions shown · non-contrast
Comparison: None.

CLINICAL DATA: Status post total hip replacement

EXAM:
OPERATIVE LEFT HIP  1 VIEW
TECHNIQUE: Fluoroscopic spot image(s) were submitted for interpretation
post-operatively.
FLUOROSCOPY TIME:  0 minutes 30 seconds; 2 acquired images

[Series 1: run · 2 of 2 slices shown]
[im 1/2]
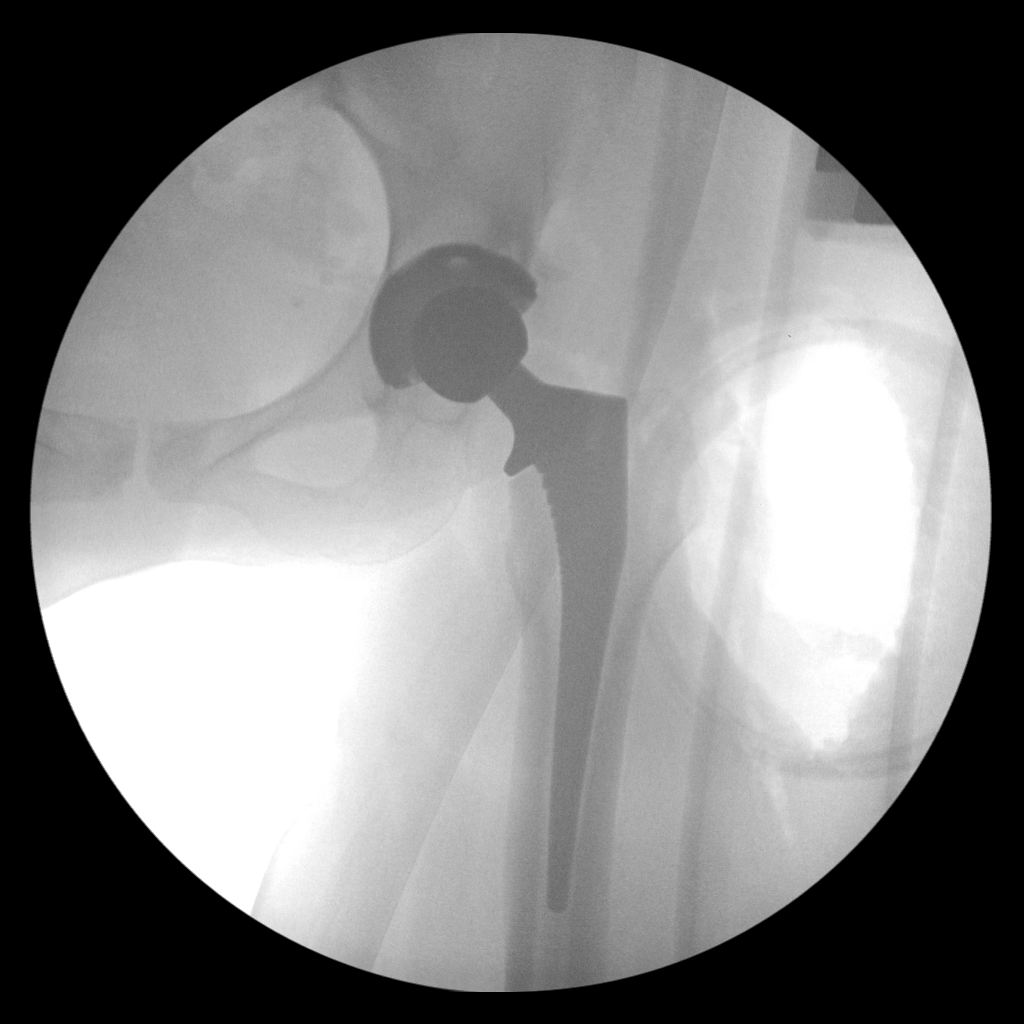
[im 2/2]
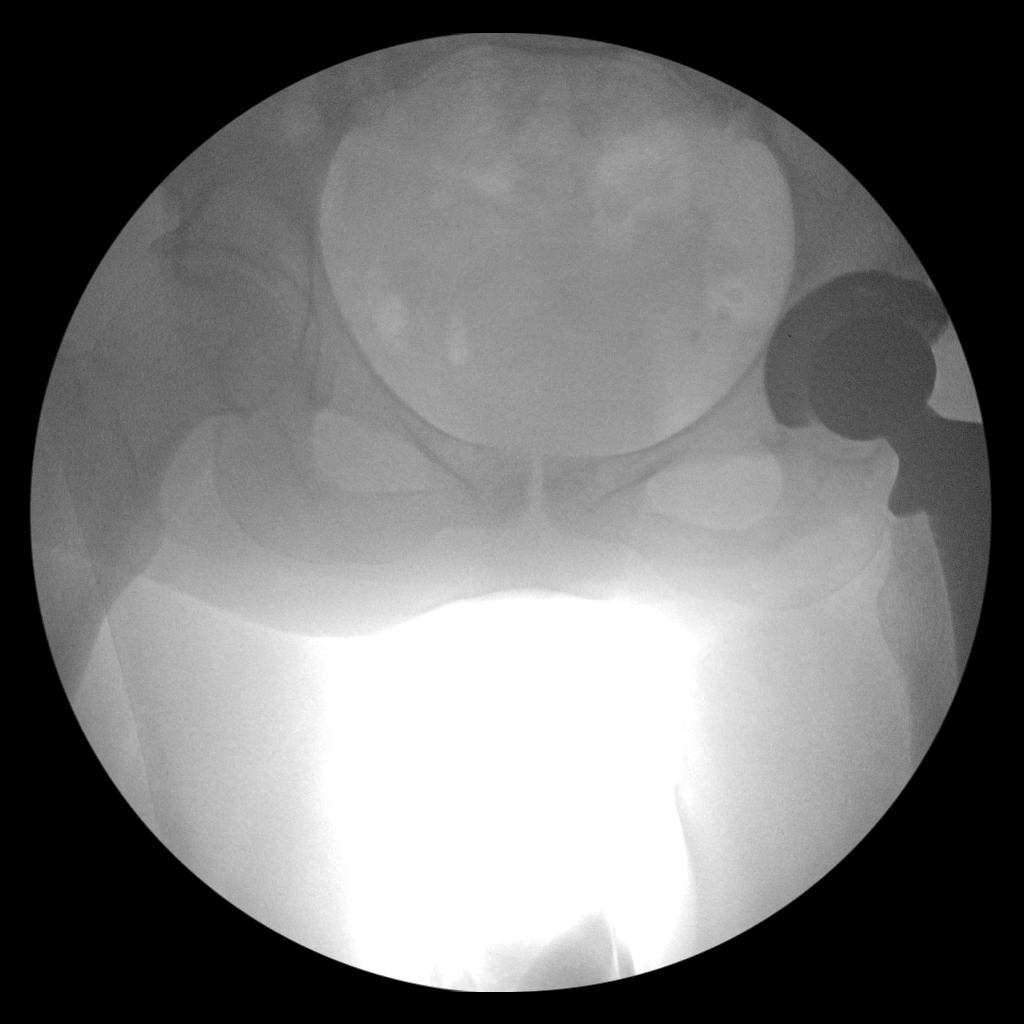

[2 of 2 positions shown; findings below may reference images not displayed]

FINDINGS: There is a total hip replacement on the left with prosthetic
components appearing well-seated on frontal view. No evident
fracture or dislocation. There is mild narrowing of the right hip
joint on frontal view.
IMPRESSION: Total hip replaced on the left with prosthetic components
well-seated. No acute fracture or dislocation evident.

## 2019-12-25 ENCOUNTER — Ambulatory Visit: Payer: BC Managed Care – PPO | Attending: Internal Medicine

## 2019-12-25 DIAGNOSIS — Z23 Encounter for immunization: Secondary | ICD-10-CM

## 2019-12-25 NOTE — Progress Notes (Signed)
   Covid-19 Vaccination Clinic  Name:  Victoria Kaufman    MRN: [id number] DOB: [birth year]  12/25/2019  [name] was observed post Covid-19 immunization for 15 minutes without incident. She was provided with Vaccine Information Sheet and instruction to access the V-Safe system.   [name] was instructed to call 911 with any severe reactions post vaccine: Marland Kitchen Difficulty breathing  . Swelling of face and throat  . A fast heartbeat  . A bad rash all over body  . Dizziness and weakness   Immunizations Administered    Name Date Dose VIS Date Route   Pfizer COVID-19 Vaccine 12/25/2019  3:45 PM 0.3 mL 09/03/2018 Intramuscular   Manufacturer: [employer], [employer]   Lot: [id number]   NDC: [address]-5

## 2020-01-15 ENCOUNTER — Ambulatory Visit: Payer: BC Managed Care – PPO | Attending: Internal Medicine

## 2020-01-15 DIAGNOSIS — Z23 Encounter for immunization: Secondary | ICD-10-CM

## 2020-01-15 NOTE — Progress Notes (Signed)
   Covid-19 Vaccination Clinic  Name:  Victoria Kaufman    MRN: [id number] DOB: September 11, 1970  01/15/2020  [name] was observed post Covid-19 immunization for 15 minutes without incident. She was provided with Vaccine Information Sheet and instruction to access the V-Safe system.   [name] was instructed to call 911 with any severe reactions post vaccine: Marland Kitchen Difficulty breathing  . Swelling of face and throat  . A fast heartbeat  . A bad rash all over body  . Dizziness and weakness   Immunizations Administered    Name Date Dose VIS Date Route   Pfizer COVID-19 Vaccine 01/15/2020 11:24 AM 0.3 mL 09/03/2018 Intramuscular   Manufacturer: [employer], [employer]   Lot: [id number]   NDC: [address]-7

## 2023-05-23 DIAGNOSIS — R030 Elevated blood-pressure reading, without diagnosis of hypertension: Secondary | ICD-10-CM | POA: Diagnosis not present

## 2023-05-23 DIAGNOSIS — U071 COVID-19: Secondary | ICD-10-CM | POA: Diagnosis not present
